# Patient Record
Sex: Male | Born: 1935 | Race: White | Hispanic: No | Marital: Married | Smoking: Former smoker
Health system: Southern US, Community
[De-identification: ages and names within clinical notes are randomized; demographics above are authoritative.]

## PROBLEM LIST (undated history)

## (undated) DIAGNOSIS — G4730 Sleep apnea, unspecified: Secondary | ICD-10-CM

## (undated) DIAGNOSIS — F0390 Unspecified dementia without behavioral disturbance: Secondary | ICD-10-CM

## (undated) DIAGNOSIS — E785 Hyperlipidemia, unspecified: Secondary | ICD-10-CM

## (undated) DIAGNOSIS — N289 Disorder of kidney and ureter, unspecified: Secondary | ICD-10-CM

## (undated) DIAGNOSIS — R55 Syncope and collapse: Secondary | ICD-10-CM

## (undated) HISTORY — DX: Hyperlipidemia, unspecified: E78.5

## (undated) HISTORY — PX: AORTIC VALVE REPLACEMENT: SHX41

## (undated) HISTORY — PX: TONSILLECTOMY: SUR1361

## (undated) HISTORY — DX: Disorder of kidney and ureter, unspecified: N28.9

## (undated) HISTORY — DX: Syncope and collapse: R55

## (undated) HISTORY — DX: Sleep apnea, unspecified: G47.30

## (undated) HISTORY — DX: Unspecified dementia, unspecified severity, without behavioral disturbance, psychotic disturbance, mood disturbance, and anxiety: F03.90

---

## 2002-10-28 HISTORY — PX: OTHER SURGICAL HISTORY: SHX169

## 2002-11-15 ENCOUNTER — Inpatient Hospital Stay (HOSPITAL_COMMUNITY): Admission: AD | Admit: 2002-11-15 | Discharge: 2002-11-22 | Payer: Self-pay | Admitting: Internal Medicine

## 2002-11-15 ENCOUNTER — Encounter (INDEPENDENT_AMBULATORY_CARE_PROVIDER_SITE_OTHER): Payer: Self-pay | Admitting: *Deleted

## 2002-11-16 ENCOUNTER — Encounter: Payer: Self-pay | Admitting: Cardiology

## 2002-11-17 ENCOUNTER — Encounter: Payer: Self-pay | Admitting: Cardiology

## 2002-11-19 ENCOUNTER — Encounter: Payer: Self-pay | Admitting: Cardiology

## 2002-11-20 ENCOUNTER — Encounter: Payer: Self-pay | Admitting: Cardiology

## 2003-03-14 ENCOUNTER — Inpatient Hospital Stay (HOSPITAL_BASED_OUTPATIENT_CLINIC_OR_DEPARTMENT_OTHER): Admission: RE | Admit: 2003-03-14 | Discharge: 2003-03-14 | Payer: Self-pay | Admitting: Cardiology

## 2003-05-02 ENCOUNTER — Encounter (INDEPENDENT_AMBULATORY_CARE_PROVIDER_SITE_OTHER): Payer: Self-pay | Admitting: Specialist

## 2003-05-02 ENCOUNTER — Inpatient Hospital Stay (HOSPITAL_COMMUNITY)
Admission: RE | Admit: 2003-05-02 | Discharge: 2003-05-08 | Payer: Self-pay | Admitting: Thoracic Surgery (Cardiothoracic Vascular Surgery)

## 2004-04-16 ENCOUNTER — Ambulatory Visit: Payer: Self-pay | Admitting: Cardiology

## 2004-04-23 ENCOUNTER — Ambulatory Visit: Payer: Self-pay | Admitting: Cardiology

## 2004-05-20 ENCOUNTER — Ambulatory Visit: Payer: Self-pay | Admitting: Cardiology

## 2004-08-24 IMAGING — CR DG CHEST 1V PORT
1 series · 1 of 1 positions shown · non-contrast
Comparison: none

CLINICAL DATA: CABG follow-up.
 PORTABLE CHEST, 05/02/03, [DATE] HOURS
 Compared to a chest x-ray from 04/29/03, endotracheal tube is in good position above the carina.  The patient has undergone CABG.  Swan-Ganz catheter is present with the tip in the region of the pulmonary outflow tract.  No pneumothorax is seen. 
 IMPRESSION
 Status-post CABG.  Endotracheal tube and Swan-Ganz catheter in satisfactory position.

[view not recorded]
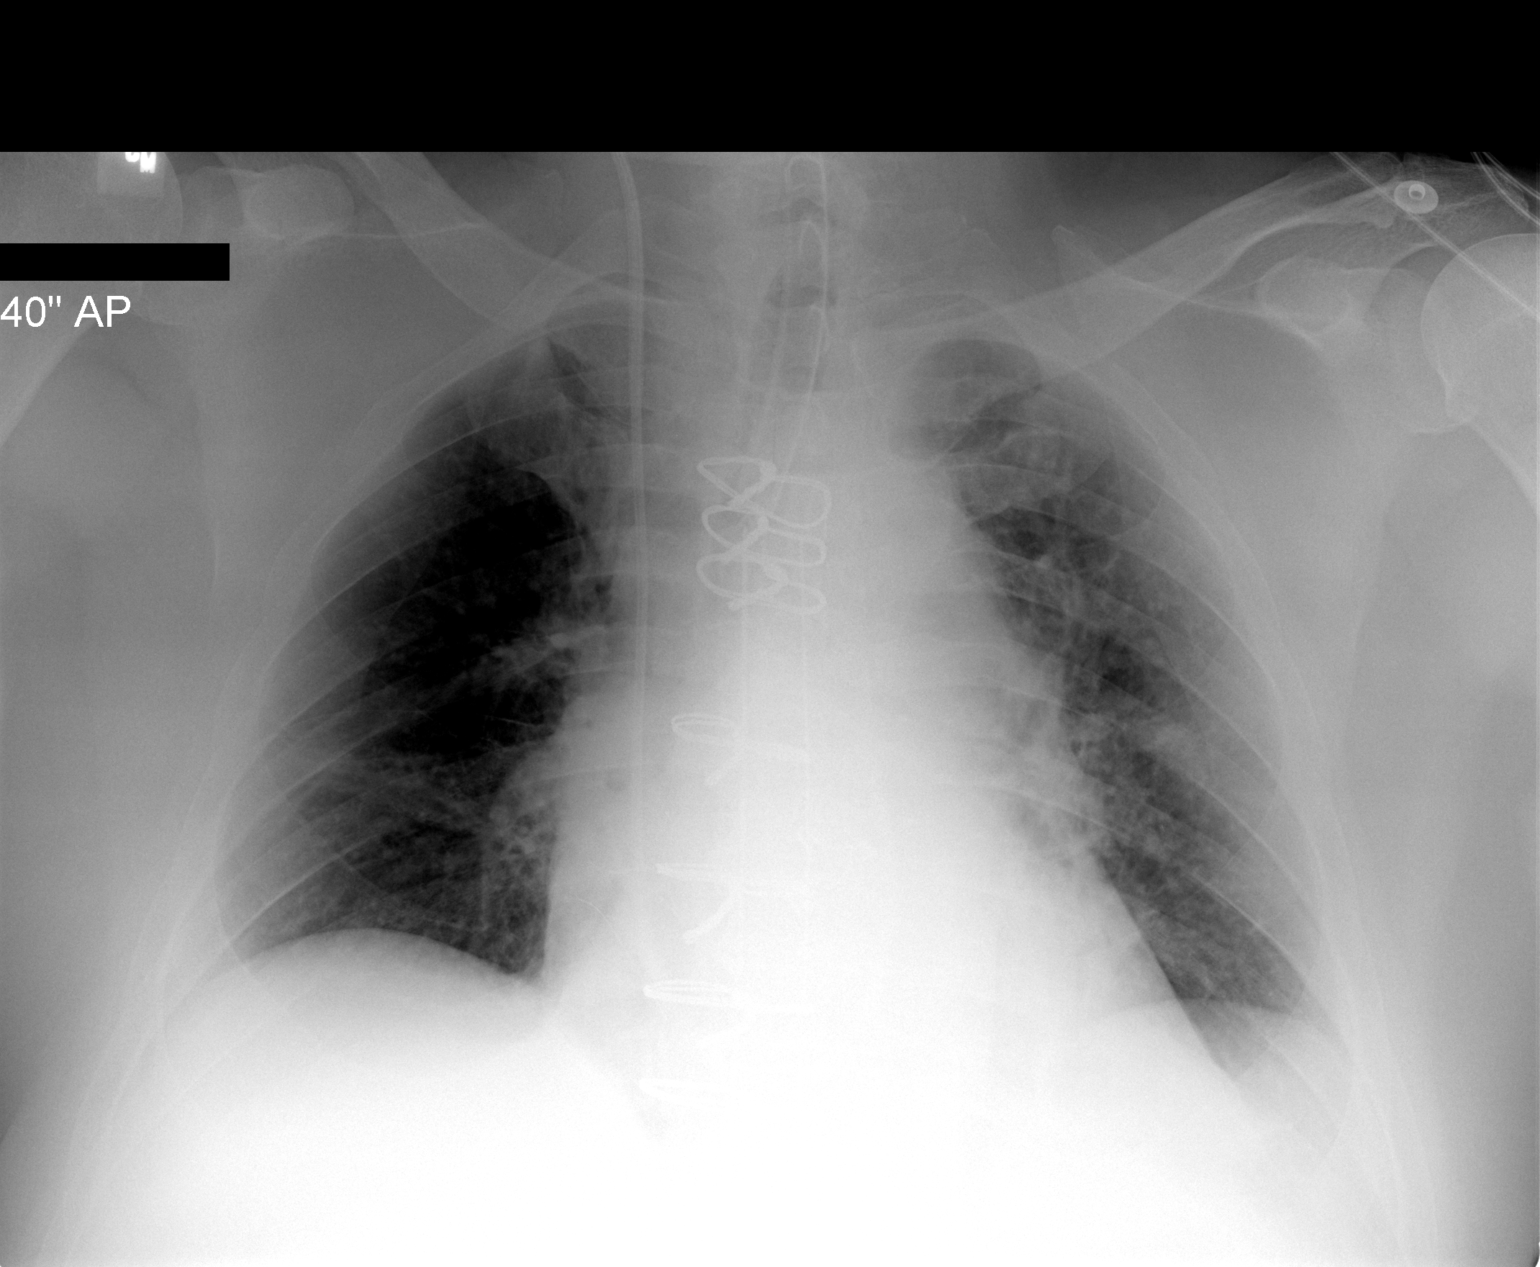

[1 of 1 positions shown; findings below may reference images not displayed]

## 2004-08-25 IMAGING — CR DG CHEST 1V PORT
1 series · 1 of 1 positions shown · non-contrast
Comparison: none

CLINICAL DATA: cardiac surgery , extubated
SINGLE PORTABLE CHEST RADIOGRAPH, 05/03/03, [DATE]
Comparison 05/02/03 and 04/29/03.

[view not recorded]
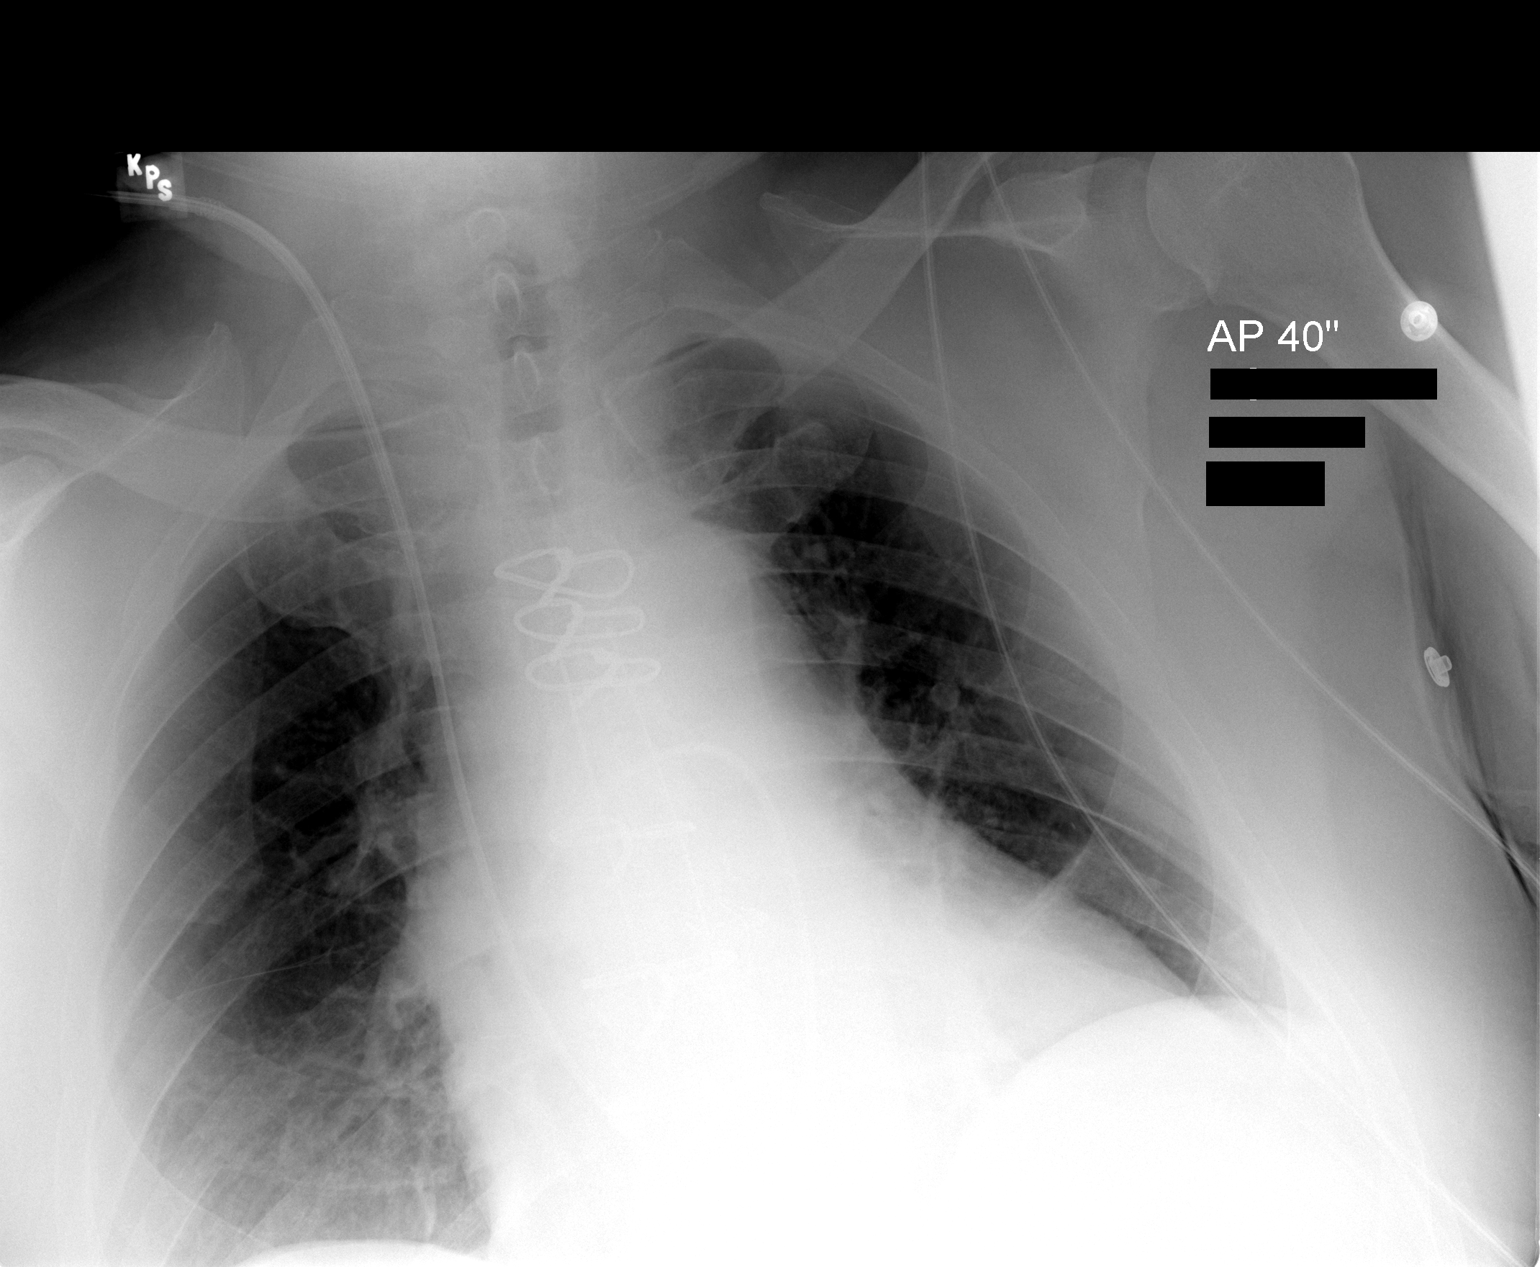

[1 of 1 positions shown; findings below may reference images not displayed]

FINDINGS: Patient has had a median sternotomy.  Endotracheal tube has been removed.  Mediastinal drains and right Swan-Ganz catheter remain.  Swan-Ganz catheter tip is in the proximal right pulmonary artery.  
There is cardiac enlargement with bilateral atelectasis, left greater than right.  Perihilar atelectasis is also evident.  There is mild vascular congestion.
IMPRESSION 
Status post extubation.  
Cardiomegaly and vascular congestion with perihilar and lower lobe atelectasis.

## 2005-12-06 ENCOUNTER — Ambulatory Visit: Payer: Self-pay | Admitting: Cardiology

## 2005-12-10 ENCOUNTER — Ambulatory Visit: Payer: Self-pay | Admitting: Cardiovascular Disease

## 2005-12-10 ENCOUNTER — Inpatient Hospital Stay (HOSPITAL_BASED_OUTPATIENT_CLINIC_OR_DEPARTMENT_OTHER): Admission: RE | Admit: 2005-12-10 | Discharge: 2005-12-10 | Payer: Self-pay | Admitting: Cardiovascular Disease

## 2005-12-23 ENCOUNTER — Ambulatory Visit: Payer: Self-pay | Admitting: Cardiology

## 2006-01-12 ENCOUNTER — Ambulatory Visit: Payer: Self-pay | Admitting: Cardiology

## 2007-11-06 ENCOUNTER — Encounter: Payer: Self-pay | Admitting: Cardiology

## 2007-11-16 ENCOUNTER — Encounter: Payer: Self-pay | Admitting: Cardiology

## 2007-12-20 ENCOUNTER — Ambulatory Visit: Payer: Self-pay | Admitting: Cardiology

## 2007-12-27 ENCOUNTER — Ambulatory Visit: Payer: Self-pay | Admitting: Cardiology

## 2008-01-14 ENCOUNTER — Ambulatory Visit: Payer: Self-pay | Admitting: Cardiology

## 2008-01-16 ENCOUNTER — Encounter: Payer: Self-pay | Admitting: Cardiology

## 2008-01-25 ENCOUNTER — Ambulatory Visit: Payer: Self-pay | Admitting: Cardiology

## 2008-05-31 ENCOUNTER — Ambulatory Visit: Payer: Self-pay | Admitting: Cardiology

## 2008-06-12 ENCOUNTER — Encounter: Payer: Self-pay | Admitting: Cardiology

## 2008-12-24 DIAGNOSIS — R609 Edema, unspecified: Secondary | ICD-10-CM

## 2008-12-24 DIAGNOSIS — I5032 Chronic diastolic (congestive) heart failure: Secondary | ICD-10-CM

## 2008-12-24 DIAGNOSIS — E785 Hyperlipidemia, unspecified: Secondary | ICD-10-CM | POA: Insufficient documentation

## 2009-02-13 ENCOUNTER — Ambulatory Visit: Payer: Self-pay | Admitting: Cardiology

## 2009-02-13 DIAGNOSIS — N189 Chronic kidney disease, unspecified: Secondary | ICD-10-CM

## 2009-02-13 DIAGNOSIS — Z954 Presence of other heart-valve replacement: Secondary | ICD-10-CM | POA: Insufficient documentation

## 2009-02-13 DIAGNOSIS — G4733 Obstructive sleep apnea (adult) (pediatric): Secondary | ICD-10-CM

## 2009-02-13 DIAGNOSIS — E119 Type 2 diabetes mellitus without complications: Secondary | ICD-10-CM

## 2009-02-13 DIAGNOSIS — Z8669 Personal history of other diseases of the nervous system and sense organs: Secondary | ICD-10-CM

## 2009-02-13 DIAGNOSIS — I10 Essential (primary) hypertension: Secondary | ICD-10-CM

## 2009-02-13 DIAGNOSIS — F068 Other specified mental disorders due to known physiological condition: Secondary | ICD-10-CM | POA: Insufficient documentation

## 2009-11-12 ENCOUNTER — Encounter: Payer: Self-pay | Admitting: Cardiology

## 2010-02-25 ENCOUNTER — Ambulatory Visit: Payer: Self-pay | Admitting: Cardiology

## 2010-02-25 DIAGNOSIS — G47419 Narcolepsy without cataplexy: Secondary | ICD-10-CM | POA: Insufficient documentation

## 2010-03-10 ENCOUNTER — Encounter: Payer: Self-pay | Admitting: Cardiology

## 2010-04-28 NOTE — Assessment & Plan Note (Signed)
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 The patient has a history of obstructive sleep apnea but is not compliant with his CPAP mask.the patient has significant dementia and is on Aricept and Namenda. The wife states that the patient often sleeps until 3:00 in the afternoon. She states that she tries to wake him up in the morning but he continues to sleep. PERRLA neck tried medications for depression but this didn't work and make the patient aggressive. He is also seeing a neurologist. From cardiac standpoint however he stable. He has some difficulty walking and has some gait imbalance. He denies any lower extremity edema or increased fluid intake. He does report some orthostatic symptoms upon standing. We did orthostatic blood pressures in the office and the patient was not orthostatic.  Preventive Screening-Counseling & Management  Alcohol-Tobacco     Smoking Status: quit     Year Quit: 2000  Current Medications (verified): 1)  Lasix 40 Mg Tabs (Furosemide) .... Take 2 Tablet By Mouth in Morning and 2 in Evening 2)  Lipitor 10 Mg Tabs (Atorvastatin Calcium) .... Take 1 Tablet By Mouth Once A Day At Bedtime 3)  Aricept 10 Mg Tabs (Donepezil Hcl) .... Take 1 Tablet By Mouth Once A Day 4)  Levamisole Hcl  Powd (Levamisole Hcl) .... Use As Directed 5)  Actos 45 Mg Tabs (Pioglitazone Hcl) .... Take 1 Tablet By Mouth Once A Day 6)  Prevacid 30 Mg Cpdr (Lansoprazole) .... Take 1 Tablet By Mouth Once A Day 7)  Toprol Xl 50 Mg Xr24h-Tab (Metoprolol Succinate) .... Take 1 Tablet By Mouth Once A Day 8)  Glipizide Xl 10 Mg Xr24h-Tab (Glipizide) .... Take 1 Tablet By Mouth Once A Day 9)  Mavik 4 Mg Tabs (Trandolapril) .... Take 1 Tablet By Mouth Once A Day 10)  Namenda 10 Mg Tabs (Memantine Hcl) .... Take 1 Tablet By  Mouth Two Times A Day 11)  Benazepril Hcl 40 Mg Tabs (Benazepril Hcl) .... Take 1 Tablet By Mouth Once A Day 12)  Felodipine 10 Mg Xr24h-Tab (Felodipine) .... Take 1 Tablet By Mouth Once A Day 13)  Provigil 200 Mg Tabs (Modafinil) .... Take 1 Tablet By Mouth Once A Day  Allergies (verified): No Known Drug Allergies  Comments:  Nurse/Medical Assistant: The patient's medication list and allergies were reviewed with the patient and were updated in the Medication and Allergy Lists.  Past History:  Past Medical History: Last updated: 02/13/2009 1. Status post aortic valve replacement, #23 Carpentier-Edwards     pericardial tissue valve with stable murmur. 2. History of syncope but no recurrence. 3. Ruled out for tachy-brady syndrome. 4. Ruled out for atrial fibrillation, atrial flutter. 5. Obstructive sleep apnea. 6. Dementia. 7. Diabetes mellitus. 8. Dyslipidemia. 9. Chronic kidney disease with creatinine of 1.5. 10.Chronic diastolic failure, lower extremity edema.  EDEMA (ICD-782.3) DIASTOLIC HEART FAILURE, CHRONIC (ICD-428.32) HYPERLIPIDEMIA-MIXED (ICD-272.4) Obstructive sleep apnea.  Dementia.  Diabetes mellitus.  Chronic kidney disease  Past Surgical History: Last updated: 12/24/2008 Tonsillectomy Percutaneous drainage of pericardial effusion in August of 2004 Valve Replacement-Aortic (S/P)  Family History: Last updated: 02/13/2009 Negative FH of Diabetes, Hypertension, or Coronary Artery Disease  Social History: Last updated: 12/24/2008 Retired  Married  Tobacco Use - Former.  Alcohol Use - no  Risk Factors: Smoking Status: quit (02/25/2010)  Review of Systems       The patient complains of shortness of breath and muscle weakness.  The patient denies fatigue, malaise, fever, weight gain/loss, vision loss, decreased hearing, hoarseness, chest pain, palpitations, prolonged cough, wheezing, sleep apnea, coughing up blood, abdominal pain, blood in stool, nausea,  vomiting, diarrhea, heartburn, incontinence, blood in urine, joint pain, leg swelling, rash, skin lesions, headache, fainting, dizziness, depression, anxiety, enlarged lymph nodes, easy bruising or bleeding, and environmental allergies.    Vital Signs:  Patient profile:   77 year old male Height:      72 inches Weight:      275 pounds BMI:     37.43 Pulse rate:   51 / minute Pulse (ortho):   60 / minute BP sitting:   95 / 55  (left arm) BP standing:   108 / 63 Cuff size:   large  Vitals Entered By: Carlye Grippe (February 25, 2010 10:49 AM)  Nutrition Counseling: Patient's BMI is greater than 25 and therefore counseled on weight management options.  Serial Vital Signs/Assessments:  Time      Position  BP       Pulse  Resp  Temp     By 11:56 AM  Lying LA  88/48    54                    Lydia Anderson 11:56 AM  Sitting   98/63    64                    Lydia Anderson 11:56 AM  Standing  108/63   60                    Carlye Grippe           Standing  113/64   55                    Carlye Grippe           Standing  110/70   74                    Carlye Grippe   Physical Exam  Additional Exam:  General: Well-developed, well-nourished in no distress head: Normocephalic and atraumatic eyes PERRLA/EOMI intact, conjunctiva and lids normal nose: No deformity or lesions mouth normal dentition, normal posterior pharynx neck: Supple, no JVD.  No masses, thyromegaly or abnormal cervical nodes lungs: Normal breath sounds bilaterally without wheezing.  Normal percussion heart: regular rate and rhythm with normal S1 and S2, no S3 or S4.  PMI is normal.  No pathological murmurs abdomen: Normal bowel sounds, abdomen is soft and nontender without masses, organomegaly or hernias noted.  No hepatosplenomegaly musculoskeletal: Back normal, normal gait muscle strength and tone normal pulsus: Pulse is normal in all 4 extremities Extremities: No peripheral pitting edema neurologic: Alert and  oriented x 3 skin: Intact without lesions or rashes cervical nodes: No significant adenopathy psychologic: Normal affect    EKG  Procedure date:  02/25/2010  Findings:      EKG demonstrates sinus bradycardia but otherwise normal. Heart rate 55 beats per minute  Impression & Recommendations:  Problem # 1:  RENAL FAILURE, CHRONIC (ICD-585.9) the patient has chronic renal insufficiency recent laboratory work demonstrates a creatinine of 2.88. This is followed by his primary care physician  Problem # 2:  DEMENTIA (ICD-294.8) the patient is on Aricept andNamenda. His symptoms are clearly  worsening  Problem # 3:  AORTIC VALVE REPLACEMENT, HX OF (ICD-V43.3) the patient is status post aortic valve replacement. He appears to have normal heart function physical examination.  Problem # 4:  NARCOLEPSY WITHOUT CATAPLEXY (ICD-347.00) the patient has symptoms consistent with narcolepsy. I gave him a prescription for modafinil 200 mg a day. He should not interfere with his other medications. I did discuss with the patient's wife that there might be some increase in blood pressure with this medication but his blood pressure is now relatively low and should not be much of an issue. We also did orthostatic blood pressure in the office today and the patient is not orthostatic.  Other Orders: EKG w/ Interpretation (93000)  Patient Instructions: 1)  Modafinil 200mg  daily 2)  Follow up in  6 months Prescriptions: PROVIGIL 200 MG TABS (MODAFINIL) Take 1 tablet by mouth once a day  #30 x 2   Entered by:   Hoover Brunette, LPN   Authorized by:   Lewayne Bunting, MD, [facility]   Signed by:   Hoover Brunette, LPN on 40/98/1191   Method used:   Print then Give to Patient   RxID:   [id number]   Handout requested.

## 2010-04-30 NOTE — Medication Information (Signed)
Summary: RX Folder/ PROVIGIL APPROVED  RX Folder/ PROVIGIL APPROVED   Imported By: Dorise Hiss 03/11/2010 11:45:06  _____________________________________________________________________  External Attachment:    Type:   Image     Comment:   External Document

## 2010-04-30 NOTE — Medication Information (Signed)
Summary: RX Folder/ FAXED PRIOR AUTHORIZATION PROVIGIL  RX Folder/ FAXED PRIOR AUTHORIZATION PROVIGIL   Imported By: Dorise Hiss 03/10/2010 15:39:30  _____________________________________________________________________  External Attachment:    Type:   Image     Comment:   External Document

## 2010-08-11 NOTE — Assessment & Plan Note (Signed)
[facility]                          [facility] NOTE   ARMONI, DEPASS                        MRN:          [id number]  DATE:05/31/2008                            DOB:          03/27/1933    REFERRING PHYSICIAN:  Kirstie Peri, MD   HISTORY OF PRESENT ILLNESS:  The patient is a 77 year old male who has  now developed significant dementia.  The patient has known aortic valve  disease status post valve replacement #23 Carpentier-Edwards pericardial  tissue valve.  Previously also had some problems with dizziness and  presyncope and was ruled out for tachy-brady syndrome.  He has  obstructive sleep apnea but does not use his CPAP mask very often.  His  wife is getting somewhat frustrated with him as his memory is really  deteriorating.  He is now seeing Dr. Benson Setting also.  He states that the  patient only sits at the kitchen table, sleeps a lot during the daytime,  and the only thing he does is eat and drink.  The patient, however, to  me reports no problems of chest pain, shortness of breath, orthopnea, or  PND.   MEDICATIONS:  1. Lipitor 10 mg p.o. at bedtime.  2. Aricept 10 mg p.o. daily.  3. Relafen 750 mg p.o. b.i.d. p.r.n.  4. Levamisole 100 units daily.  5. Aspirin 81 daily.  6. Actos 45 daily.  7. Prevacid 30 mg daily.  8. Toprol-XL 100 mg daily.  9. Lasix 40 mg p.o. b.i.d.  10.Amlodipine 10 mg p.o. b.i.d.  11.Glipizide ER 10 mg p.o. b.i.d.  12.Mavik 4 mg p.o. daily.  13.The patient also takes another medication for his dementia, but his      wife did not bring the list with her.   PHYSICAL EXAMINATION:  VITAL SIGNS:  Blood pressure 132/60, heart rate  62, respiratory rate 18.  GENERAL:  Overweight white male but in no apparent distress.  HEENT:  Pupils anisocoric.  Conjunctivae clear.  NECK:  Supple.  Normal carotid upstroke and no carotid bruit.  LUNGS:  Clear breath sounds bilaterally.  HEART:  Regular rate and rhythm.   Normal S1 and S2.  There is a 2/6  systolic murmur at the left and right upper sternal border.  There is  otherwise no S3.  ABDOMEN:  Soft and nontender.  The patient does have 3+ peripheral  pitting edema.   PROBLEM LIST:  1. Status post aortic valve replacement, #23 Carpentier-Edwards      pericardial tissue valve with stable murmur.  2. History of syncope but no recurrence.  3. Ruled out for tachy-brady syndrome.  4. Ruled out for atrial fibrillation, atrial flutter.  5. Obstructive sleep apnea.  6. Dementia.  7. Diabetes mellitus.  8. Dyslipidemia.  9. Chronic kidney disease with creatinine of 1.5.  10.Chronic diastolic failure, lower extremity edema.   PLAN:  1. The patient will have his Lasix increased to 80 in the morning and      40 in the evening.  2. From cardiovascular standpoint, however, stable.  He also had no  recurrent presyncope or syncope.  His heart rate is stable at 62.  3. I asked the patient to be compliant with a CPAP mask.  4. We will follow the patient from a cardiovascular standpoint is 6      months.     Learta Codding, MD,FACC  Electronically Signed    GED/MedQ  DD: 05/31/2008  DT: 05/31/2008  Job #: [id number]   cc:   Kirstie Peri, MD

## 2010-08-11 NOTE — Assessment & Plan Note (Signed)
[facility]                          [facility] NOTE   Cody Le, Cody Le                        MRN:          [id number]  DATE:12/20/2007                            DOB:          [birth year]    CARDIOLOGIST:  Learta Codding, MD, [facility]   PRIMARY CARE PHYSICIAN:  Kirstie Peri, MD   REASON FOR VISIT:  Post hospitalization follow-up.   HISTORY OF PRESENT ILLNESS:  Cody Le is a 77 year old male Le  with a history of aortic stenosis status post #23 Carpentier-Edwards  pericardial tissue aortic valve replacement in 2005, as well as a  history of diastolic congestive heart failure with overall preserved LV  function with an EF of 55% by echocardiogram in 2007.  He had  significant volume overload back in 2007, and he was referred for right  heart catheterization to rule out constrictive pericarditis.  At that  time his hemodynamics were consistent with diastolic dysfunction.  A  transesophageal echocardiogram demonstrated no significant mitral  regurgitation.  He did have mild pulmonary hypertension.  There was no  aortic stenosis noted.  We have actually not seen Cody Le in Cody  office since October 2007.  He was recently admitted by Dr. Sherryll Burger at  [facility] with complaints of shortness of breath.  He was  observed for 48 hours and discharged to home.  He is now returning to  see Dr. Andee Lineman for Cody first time since his hospitalization.   Cody Le tells me that he does not remember much of what happened Cody  day he was admitted to [facility] hospital.  His wife says that he passed out  in Cody garage.  This was not witnessed.  He apparently fell over and  broke some type of clothes rack in Cody garage.  His wife notes that he  made his way into Cody house and was somewhat diaphoretic and complaining  of abdominal pain and nausea.  She also notes that he told her that he  felt like he was dying.  He complained of chest pain and  shortness of  breath.  She noted that he had increased work of breathing, but no  cyanosis was noted.  He was brought to [facility] emergency room for further  evaluation and treatment.  Of note during his hospitalization his  creatinine was 1.5.  His cardiac markers were negative x3.  His BNP was  slightly elevated at 299.  His hemoglobin was 13.5.  His chest x-ray  showed stable cardiomegaly, no acute cardiopulmonary disease.  His  electrocardiogram revealed sinus rhythm, heart rate of 69, normal axis,  no acute changes.  Cody Le tells me that he was apparently treated  with some IV Lasix during his hospitalization.   Since going to [facility] hospital, Cody Le has felt well.  He feels like  he is back in his usual state of health.  He denies any significant  shortness of breath with exertion.  He describes NYHA class II to class  IIB symptoms.  He denies orthopnea or PND.  He has chronic pedal edema  without significant change.  His weights have actually gone down since  he was in [facility] hospital.  According to our scales, he has lost over 40  pounds since we saw him on October 2007.  He denies any recurrent chest  pain.  He denies any recurrent syncope or near syncope or palpitations.   CURRENT MEDICATIONS:  Lipitor 10 mg nightly, Aricept 10 mg daily,  Relafen 750 mg b.i.d. p.r.n., Levamisole 100 units daily, Aspirin 325 mg  daily, Actos 45 mg daily, Prevacid 30 mg daily,  Toprol XL 100 mg daily, Levemir insulin 6 units b.i.d., Felodipine 10 mg  daily, Glipizide ER 10 mg b.i.d., Mavik 4 mg daily, Lasix 40 mg b.i.d.   ALLERGIES:  No known drug allergies.   REVIEW OF SYSTEMS:  Cody Le denies any antecedent fevers, chills,  cough, nausea, vomiting, or diarrhea prior to his syncopal episode.   PHYSICAL EXAMINATION:  GENERAL:  He is a well-nourished well-developed  obese male in no distress.  VITAL SIGNS:  Blood pressure is 136/79, pulse 65, weight 296 pounds.  HEENT:  Normal.  NECK:  I  cannot appreciate JVD.  CARDIAC:  Normal S1, accentuated S2, regular rate and rhythm, 1/6  systolic ejection murmur best heard at right sternal border.  LUNGS:  Clear to auscultation bilaterally.  No rales.  ABDOMEN:  Soft, nontender.  EXTREMITIES:  With 1+ ankle edema bilaterally.  NEUROLOGIC:  He is alert and oriented x3.  Cranial nerves II through XII  were grossly intact.  VASCULAR:  There is a faint right carotid bruit noted (versus radiating  murmur).  Dorsalis pedis and posterior tibialis pulses 2+ bilaterally.   ASSESSMENT AND PLAN:  1. Syncope in a 77 year old male Le with a history of a      pericardial tissue aortic valve replacement in 2005 and minimal      nonobstructive coronary disease by cardiac catheterization with      overall preserved left ventricular function and a history of      chronic diastolic congestive heart failure.  Cody Le's cardiac      catheterization in 2004 revealed 25% ostial left main stenosis, 25%      proximal LAD stenosis, 30% stenosis in Cody mid AV groove.  He is a      diabetic and is at risk for progressive coronary artery disease.      However, Cody Le has already seen Dr. Sherryll Burger who arranged an      outpatient stress test.  This was performed on December 14, 2007.      Cody Le exercised for a total of 3 minutes.  His exercise      tolerance was poor.  His myocardial perfusion was noted to be      normal.  His EF was calculated at 48%.  Cody Le's last      echocardiogram was done in 2007 and this demonstrated normal left      ventricular function.  After further review with Dr. Andee Lineman, we      have decided to proceed with an echocardiogram and a 21-day event      monitor to complete Cody workup for a cardiac cause for his syncope.      At this point it is uncertain whether or not he actually did have a      syncopal episode and Cody etiology is not entirely clear.  He does      have a carotid bruit on exam.  We will also  obtain carotid      Dopplers, although this is likely not a cause for his syncope.  He      may need a neurologic cause ruled out should his cardiac workup      remain negative.  He will be brought back in follow-up after Cody      above testing is completed.  2. Diabetes mellitus.  He will continue to follow up with Dr. Sherryll Burger.  3. Hypertension.  This is overall well controlled and he will continue      on medications as outlined above.  4. Dyslipidemia.  He will continue on Lipitor and followup with Dr.      Sherryll Burger for this.  5. Chronic kidney disease.  His recent creatinine was 1.5.  This seems      to be overall stable.  6. Chronic diastolic congestive heart failure.  He seems to be      optivolemic on exam.  No further changes in his medications will be      made today.  7. Status post aortic valve replacement as outlined above.  Follow-up      echocardiogram will be obtained to reassess his aortic valve, as      well as his left ventricular function.  8. Obstructive sleep apnea.  He will continue on CPAP therapy and      follow up with Dr. Benson Setting as directed.   DISPOSITION:  Cody Le will be brought back in follow-up in Cody next  4 weeks with Dr. Andee Lineman sooner p.r.n.      Tereso Newcomer, PA-C  Electronically Signed      Learta Codding, MD,FACC  Electronically Signed   SW/MedQ  DD: 12/20/2007  DT: 12/21/2007  Job #: [id number]   cc:   Kirstie Peri, MD

## 2010-08-11 NOTE — Assessment & Plan Note (Signed)
[facility]                          [facility]   Cody Le, Cody Le                        MRN:          [id number]  DATE:01/25/2008                            DOB:          1933/03/09    REFERRING PHYSICIAN:  Kirstie Peri, MD   REFERRING PHYSICIAN:  Kirstie Peri, MD   HISTORY OF PRESENT ILLNESS:  The patient is a 77 year old male with a  history of diabetes mellitus and chronic diastolic heart failure.  The  patient is status post aortic valve replacement for aortic stenosis.  He  also has obstructive sleep apnea.  More recently, the patient has  developed an Alzheimer dementia which according to his wife has  progressed.  The patient presents for followup after he had an office  visit on December 20, 2007.  After he has been hospitalized, the  patient had a questionable syncopal episode and evaluation for tachy-  brady syndrome was made with a CardioNet monitor.  Review of the  CardioNet monitor demonstrated heart rates varying from 36-122 beats per  minute.  There was very brief episodes of PF and flutter, but the heart  rates never exceeded 100 beats per minute.  He had only one run of a 5-  beat complex of wide complex tachycardia at under 50 beats per minute.  There were no associated symptoms.  Of note, the patient's CHAD2 score  equals to 3.  He has diabetes mellitus, hypertension, and congestive  heart failure.  The patient stated, overall, he is doing better since  his last office visit.  He has no chest pain.  No shortness of breath.  He has had no presyncope or syncope and no palpitations.  He denies any  orthopnea or PND.  Carotid Dopplers were also ordered which showed  essentially nonobstructive carotid artery disease.   MEDICATIONS:  1. Lipitor 10 mg p.o. nightly.  2. Aricept 10 mg p.o. daily.  3. Relafen 750 mg p.o. b.i.d. p.r.n.  4. Levamisole 100 units p.o. daily.  5. Enteric-coated aspirin 325 mg p.o.  daily.  6. Actos 45 mg p.o. daily.  7. Prevacid 30 mg p.o. daily.  8. Toprol-XL 100 mg p.o. daily.  9. Amlodipine 10 mg p.o. daily.  10.Glipizide ER 10 mg p.o. b.i.d.  11.Mavik 4 mg p.o. daily.  12.Lasix 40 mg p.o. b.i.d.   PHYSICAL EXAMINATION:  VITAL SIGNS:  Blood pressure 160/74, heart rate  56, and weight is 371 pounds.  GENERAL:  A well-nourished white male and overweight, but in no  distress.  HEENT:  Pupils are equal.  Conjunctivae are clear.  NECK:  Normal carotid upstroke.  Soft carotid bruits.  HEART:  Normal S1.  Normal closing click of S2.  Regular rate and  rhythm.  No systolic murmur at sternal border.  LUNGS:  Clear to auscultation bilaterally.  No crackles.  ABDOMEN:  Soft and nontender.  EXTREMITIES:  Trace to 1+ ankle edema bilaterally.  Dorsalis pedis and  posterior tibial pulses are intact bilaterally.  NEUROLOGIC:  The patient is alert and grossly non focal.   PROBLEM LIST:  1. Status post aortic valve replacement with a #23 Carpentier-Edwards      pericardial tissue valve, stable.  2. Questionable syncope/questionable tachy-brady syndrome.  3. Rule out atrial fibrillation and atrial flutter.  4. Bradycardia.  5. Obstructive sleep apnea.  6. Diabetes mellitus.  7. Rule out dysautonomia.  8. Dyslipidemia.  9. Chronic kidney disease.  10.Chronic diastolic failure.   PLAN:  1. The patient had no further episodes of dizziness or definitively no      loss of consciousness.  His CardioNet monitor demonstrate that he      possibly has a tachy-brady syndrome with a lowest heart rate of 36.      His upper heart rates were never really very high.  Therefore, I      have given him a recommendation to cut back on his Toprol-XL 50 mg      p.o. daily and we will follow him closely.  I do not think there is      any pacemaker indication as of yet.  2. The patient's progressing Alzheimer dementia precludes him from      using a Coumadin.  He only had very brief runs  of atrial      fibrillation and atrial flutter; however, asymptomatic.  I would be      really concerned to give this gentleman Coumadin therapy as he has      a risk for falls with his dementia.  We will continue aspirin.  3. I have discussed all the above with his wife very carefully and she      understands the plan.  4. The patient will follow up with [place] in the next couple of months.      He also had a recent Cardiolite study done by Dr. Sherryll Burger which was      within normal limits with no ischemia.     Learta Codding, MD,FACC  Electronically Signed    GED/MedQ  DD: 01/28/2008  DT: 01/29/2008  Job #: [id number]   cc:   Kirstie Peri, MD

## 2010-08-14 NOTE — Cardiovascular Report (Signed)
NAMESHAMIR, Cody Le                             ACCOUNT NO.:  [id number]   MEDICAL RECORD NO.:  [id number]                   PATIENT TYPE:  OIB   LOCATION:  6501                                 FACILITY:  [facility]   PHYSICIAN:  Rollene Rotunda, M.D.                DATE OF BIRTH:  [birth year]   DATE OF PROCEDURE:  03/14/2003  DATE OF DISCHARGE:                              CARDIAC CATHETERIZATION   PRIMARY CARE PHYSICIAN:  Dr. Weyman Pedro   PROCEDURE:  1. Left and right heart catheterization.  2. Coronary arteriography.   INDICATIONS:  Evaluate patient with aortic stenosis and increasing dyspnea.   PROCEDURAL NOTE:  The patient was catheterized using a #5-French sheath  placed via the modified Seldinger technique with an anterior wall puncture  and to the right femoral artery.  An 8-French venous sheath was inserted via  the same method into the right femoral vein.  A 5-French Judkins catheters  and a Swan-Ganz catheter were utilized.  The patient tolerated procedure  well and left the laboratory in stable condition.   RESULTS:  HEMODYNAMICS:  RA mean 9.  RV 35/10.  PA mean 23.  Pulmonary capillary wedge pressure mean  17.  LV 168/16.  AO 128/70.  Cardiac output 5.5.  Cardiac index 2.2 (Fick).  Aortic valve area 0.77.  Aortic valve index 0.31.  Peak gradient 40, mean  32.   CORONARIES:  The left main had ostial 25% stenosis.  The proximal LAD had 25% stenosis.  There was a large first diagonal which was normal.  The circumflex was a  large system.  There was a small ramus intermediate which was normal.  There  was 30% stenosis in the mid AV groove between OM1 and OM2.  OM1 and OM2 were  moderate sized vessels and normal.  OM3 was a large vessel with diffuse  luminal irregularities.  The right coronary artery was dominant and normal.   LEFT VENTRICULOGRAM:  The left ventriculogram was obtained in the RAO  projection.  The EF was 65%.   CONCLUSION:  1. Severe aortic stenosis.  2.  Well preserved left ventricular function.  3. Nonobstructive coronary disease.   PLAN:  The patient will be referred back to Dr. Andee Lineman to discuss aortic  valve replacement.  He is also to follow up for questionable sleep apnea.                                               Rollene Rotunda, M.D.    JH/MEDQ  D:  03/14/2003  T:  03/14/2003  Job:  [id number]   cc:   Weyman Pedro, M.D.   [facility] [place]

## 2010-08-14 NOTE — Assessment & Plan Note (Signed)
[facility]                            [facility]   Cody Le                        MRN:          [id number]  DATE:12/23/2005                            DOB:          1932/07/05    PRIMARY CARDIOLOGIST:  Dr. Andee Lineman.   REASON FOR OFFICE VISIT:  Post hospital/elective diagnostic catheterization  followup.   [name] is a 77 year old male with complex medical history, recently  referred to Dr. Andee Lineman in consultation on September 11, here at [facility] for evaluation of severe lower extremity edema with question of  anasarca.   The patient was felt to have possible positive Kussmaul's sign suggestive of  constrictive pericarditis following an extensive workup, including rule out  of thromboembolic disease, as well as evaluation by Dr. Kristian Covey for renal  insufficiency.   The patient was treated aggressively with IV diuretic and was discharged  after 4 days.  An echocardiogram was done in the hospital, revealed mild  LVH/a preserved LV function (EF 55%) with suggestion of mild-moderate mitral  regurgitation.  The bioprosthetic aortic valve was well seated with no  evidence of perivalvular leak (mean pressure gradient 17 mmHg); there was no  evidence of pericardial effusion.   Arrangements were made for a followup diagnostic catheterization in the [facility] the following day.  This was arranged as a right heart catheterization  (left heart cath was deferred secondary to renal insufficiency and no  complaint of chest pain).  The hemodynamics were consistent with diastolic  dysfunction and it was noted that the most pertinent finding was a large V  wave in the pulmonary capillary wedge position.  Dr. Excell Seltzer commented that  the hemodynamics were not consistent with pericardial constriction.  Recommendation was to continue treatment for diastolic dysfunction.   The patient now presents for followup.  He reports no  significant change  clinically since being discharged.  His wife, however, feels that despite  the fact that he has lost some 20 pounds since his recent hospitalization,  he seems to be gaining his fluid back.  He apparently is quite somnolent  during the day, but does have diagnosed sleep apnea and is reportedly on  CPAP.  The diuretic regimen apparently was up titrated to the current Lasix  80 b.i.d.   CURRENT MEDICATIONS:  1. Lasix 80 mg b.i.d.  2. Lipitor 40 daily.  3. Coated aspirin 325 daily.  4. Actos 45 daily.  5. Prevacid 30 daily.  6. Toprol XL 100 daily.  7. Levemir 6 units daily.  8. Felodipine 10 mg daily.   PHYSICAL EXAMINATION:  Blood pressure 130/72.  Pulse 68, regular.  Weight  339.  NECK:  Palpable carotid pulses without bruits; unable to assess for JVD  secondary to neck girth.  LUNGS:  Clear to auscultation in all fields.  HEART:  Regular rate rhythm (S1, S2), harsh grade 2-3/6 systolic ejection  murmur at the base; no diastolic blow.  ABDOMEN:  Protuberant, nontender.  EXTREMITIES:  Right groin stable with no ecchymosis, hematoma, or bruit on  auscultation; palpable femoral  pulse.  Noted bilateral lower extremity edema  with some mild pitting.  NEUROLOGIC:  Somnolent.   IMPRESSION:  1. Persistent severe lower extremity edema.  2. Diastolic heart failure.      a.     No evidence of constrictive pericarditis by recent right heart       catheterization.  3. History of nonobstructive coronary artery disease.  4. Status post bioprosthetic AVR in 2004.  5. Obstructive sleep apnea.  6. Morbid obesity.  7. Insulin-requiring diabetes mellitus.  8. Dyslipidemia.  9. Status post acute/chronic renal insufficiency.      a.     History of negative duplex scan for renal artery stenosis.      b.     Hypertension, stable.   PLAN:  At this juncture, recommendation is to continue the current diuretic  regimen.  We will check followup labs with a complete metabolic  profile and  a BMP level.  We will also check a room air blood gas.  Of note, for further  assessment of possibly severe mitral regurgitation (possible underestimation  of the severity of mitral regurgitation by recent echocardiogram), we will  schedule a transesophageal echocardiogram for early next week.  We will then  plan on having the patient return to the clinic for continued close followup  with Dr. Andee Lineman in the next 2-3 weeks.     ______________________________  Rozell Searing, PA-C    ______________________________  Learta Codding, MD,FACC   GS/MedQ  DD:  12/23/2005  DT:  12/25/2005  Job #:  [id number]   cc:   Weyman Pedro

## 2010-08-14 NOTE — Discharge Summary (Signed)
[name], [name]                             ACCOUNT NO.:  [id number]   MEDICAL RECORD NO.:  [id number]                   PATIENT TYPE:  INP   LOCATION:  4710                                 FACILITY:  [facility]   PHYSICIAN:  Pricilla Riffle, M.D.                 DATE OF BIRTH:  1932/12/12   DATE OF ADMISSION:  11/15/2002  DATE OF DISCHARGE:  11/22/2002                           DISCHARGE SUMMARY - REFERRING   DISCHARGE DIAGNOSES:  1. Chest pain resolved.  2. Known coronary artery disease.  3. Atrial fibrillation with conversion to normal sinus rhythm.  4. Moderate aortic stenosis.  5. Pericardial effusion, status post emergent PAP.  6. Diabetes mellitus, oral agent dependent.  7. Hypertension.  8. Metabolic syndrome.   HOSPITAL COURSE:  [name] is a 77 year old male patient of Dr. Weyman Pedro who presented to [facility] on November 14, 2002 with  chest pain.  Cardiology was consulted on November 15, 2002.  The patient tells  [place] that he initially presented to the [facility] Emergency Room, was kept  overnight and reportedly underwent a tread mill test the next day. He states  that he stayed on the treadmill for approximately 1 minute.  Apparently he  ruled out for myocardial infarction during that admission and was sent home  and told that he probably had reflux.  The wife remained concerned and  proceeded to bring the patient to [facility].  Over the past  several weeks the patient has had several episodes of paleness, diaphoresis,  and shortness of breath.  His functional capacity has also declined over the  past 3 months. On several occasions he has had brief episodes of substernal  chest tightness in the emergency room.  The patient was admitted to the  hospital by Dr. Eliberto Ivory to rule out myocardial infarction.  A 12-lead EKG  revealed no acute changes.  Upon examination the patient was noted to have a  holosystolic murmur at the apex as well as  JVD.  The patient then underwent  a 2-D echo that revealed mild LV systolic dysfunction with mild ventricular  dilatation, moderate aortic stenosis.  A circumferential pericardial  effusion with intrapericardial echo.  A CT of the chest was also obtained  with an elevated D-dimmer of 1552.  This did confirm a moderate sized  pericardial effusion with small bilateral pleural effusions.  The patient  was then transferred to [facility] for urgent catheterization.   The patient underwent left and right heart catheterization on November 15, 2002 under the care of Dr. Nona Dell.  The patient was found to have  left main 60% and eventually intracoronary nitro was administered which  revealed an eccentric 30% osteal left main stenosis which did not appear to  be hemodynamically significant.  LAD proximal 40%, 30% midstenosis,  circumflex 40% mid, RCA dominant with no major obstructive  stenosis.  LV-  gram revealed an EF of 60% with 1+ MR with no focal wall motion  abnormalities.  The right heart catheterization revealed dynamics consistent  with tamponade physiology, especially with the history of moderate-to-large  circumferential pericardial effusion resulting in moderate aortic stenosis  with a calculated valve are of 1.0 cm.sq.   During the procedure the patient developed new onset of atrial fibrillation  with RBR.  At this point he decompensated and his systolic blood pressure  dropped from 150 to less than 60.  Dr. Samule Ohm then performed an emergent  pericardial centesis resulting in immediate improvement in the patient's  hemodynamic status. At this point we are awaiting full diagnostic studies,  but so far these have been negative.  There are severe today still pending  and these will need to be looked at through E-chart when the patient comes  into the office.  There was a question of gram positive rods and this was  felt to be a skin contaminant, otherwise cytology was  negative.   A TEE was performed by Dr. Vida Roller on November 21, 2002 which revealed  LV of normal size and function with EF of 55% and no wall motion  abnormalities.  The left atrium was mildly enlarged with small LAA.  RA  mildly enlarged. There was minimal pericardial thickening.  The aortic valve  was trileaflet.  There was severe calcification with a pink gradient of 35  mm.  There was moderate stenosis with mild AI.   By November 22, 2002 the patient was felt ready to be discharged to home.  At  this point we will discharge him to home with a follow up echo in 1 week.  At that point he will then seen Dr. Andee Lineman.   Lab studies during the patient's sty include white count of 12.5 (post  procedure), hemoglobin 12.6, hematocrit 30.1, platelets 261.  Sodium 134,  potassium 4.0, BUN 14, creatinine 1.1, maximum BNP 269.  TSH 1.329.  Again  pericardial fluids will need to be reviewed once the patient comes in for  follow up next week.   The patient also underwent a repeat CT of the chest with contrast  postprocedure and this revealed tiny residual of pericardial effusion versus  pericardial thickening.  The patient did require drain in place for  approximately 48 hours after the paracentesis.  Bilateral pleural effusions  persistent.  CT of the abdomen was also performed that revealed bilateral  nephrolithiasis with a possible cyst in the lower pole of the right kidney.  An ultrasound was then performed and this revealed no hydronephrosis, no  renal mass or calculi noted by ultrasound.   DISCHARGE MEDICATIONS:  1. Glucotrol 10 mg a day.  2. Glucophage 1000 mg twice a day.  3. HCTZ 25 mg a day.  4. Lipitor 40 mg a day (new dose).  5. Mavik 4 mg 2 tablets daily.  6. Baby aspirin 81 mg a day.  7. Lopressor 50 mg 1/2 tablet twice a day.  8. Sublingual nitroglycerin p.r.n. chest pain.  9. Tylenol 1-2 tablets q.6h. as needed for pain.   DISCHARGE INSTRUCTIONS: 1. No strenuous  activity.  2. No driving until seen in the office.  3. Remain on a low fat diet.  4.     Clean over puncture sites with soap and water.  5. Call for questions or concerns.  Dr. Margarita Mail office will call the     patient and give him an appointment  for 2-D echo prior to the visit with     Dr. Andee Lineman.  These visits will occur on the same day.      Guy Franco, P.A. [facility]                      Pricilla Riffle, M.D.    LB/MEDQ  D:  11/22/2002  T:  11/22/2002  Job:  [id number]   cc:   Oley Balm, M.D.  [facility]   Learta Codding, M.D.  [address]  [address]  [place]  [place] [address]

## 2010-08-14 NOTE — Op Note (Signed)
Cody Le, Cody Le                             ACCOUNT NO.:  [id number]   MEDICAL RECORD NO.:  [id number]                   PATIENT TYPE:  INP   LOCATION:  2301                                 FACILITY:  [facility]   PHYSICIAN:  Judie Petit, M.D.              DATE OF BIRTH:  10-Mar-1933   DATE OF PROCEDURE:  05/02/2003  DATE OF DISCHARGE:                                 OPERATIVE REPORT   PROCEDURE:  Transesophageal echocardiography.   ATTENDING:  Judie Petit, M.D.   IDENTIFICATION:  [name] is a 77 year old male who presents today for  aortic valve replacement by Dr. Purcell Nails.   INDICATION FOR PROCEDURE:  The TEE will be utilized intraoperatively.  The  patient has no history of esophageal problems.   DESCRIPTION OF PROCEDURE:  The patient was brought to the holding area where  pulmonary artery and arterial lines were placed under local anesthesia  without difficulty.  The patient was subsequently taken to the operating  room where routine induction of anesthesia was performed.  The TEE sleeve  was lubricated and the probe was placed down the esophagus without  difficulty.   PRE-BYPASS CARDIOPULMONARY EXAMINATION:  Left ventricle:  The left ventricle  was concentrically thickened.  Papillary muscles were well-outlined.  There  were no masses noted within the left ventricular chamber.  Ejection fraction  was within normal limits.   Aortic valve:  The aortic valve was trileaflet in nature.  The valve was  calcified and severe aortic stenosis was noted.  Aortic valve area measured  was approximately 0.7.  There was no significant aortic insufficiency.   Mitral valve:  The mitral valve appeared to open and close appropriately.  There was no significant mitral regurgitant flow.   Left atrium:  The left atrium appeared free of any masses.  The intra-atrial  septum was intact.  The appendage was not well-visualized.   Right ventricle:  The right ventricle appeared  overall within normal limits.  The tricuspid valve was normal in appearance.   The patient was placed on cardiopulmonary bypass and underwent aortic valve  repair.  At the completion of the procedure, deairing maneuvers were carried  out.  The patient was taken off cardiopulmonary bypass on the initial  attempt.   POST-CARDIOPULMONARY BYPASS LIMITED EXAMINATION:  Left ventricle:  The left  ventricle appeared normal.  On initial examination, low volume was noted but  with replacement of volume, this improved.   Aortic valve:  The aortic valve appeared to be seated properly.  There did  not appear to be any obstruction to flow.  There was no significant aortic  insufficiency.   The rest of the cardiac exam was as previously described.  Judie Petit, M.D.    CE/MEDQ  D:  05/02/2003  T:  05/03/2003  Job:  [id number]

## 2010-08-14 NOTE — Cardiovascular Report (Signed)
NAMEJOEL, COWIN                 ACCOUNT NO.:  [id number]   MEDICAL RECORD NO.:  [id number]          PATIENT TYPE:  OIB   LOCATION:  1965                         FACILITY:  [facility]   PHYSICIAN:  Veverly Fells. Excell Seltzer, MD  DATE OF BIRTH:  1932-11-13   DATE OF PROCEDURE:  12/10/2005  DATE OF DISCHARGE:                              CARDIAC CATHETERIZATION   PROCEDURE:  Left and right heart catheterization.   INDICATION:  Mr. Rathke is a 77 year old obese male who has had a great deal  of weight gain and exertional dyspnea. He has been noted to have anasarca on  examination and with his past history of cardiac surgery as well as a  possible Kussmaul sign on physical examination. The patient was referred for  left and right cardiac catheterization to rule out pericardial constrictive  disease.   Access is right femoral artery and right femoral vein.   COMPLICATIONS:  None.   PROCEDURAL DETAILS:  Procedural risks and indications were explained to the  patient. Informed consent was obtained. The right groin was prepped, draped  and anesthetized with 1% lidocaine under normal sterile conditions. Using  the modified Seldinger technique, a #7 French venous sheath was placed in  the right common femoral vein and a #5 [place] arterial sheath was placed in  the right common femoral artery. Initially the aortic valve was crossed with  first attempt using a J-tipped wire and an angled pigtail catheter. This was  not successful at crossing his bioprosthetic aortic valve. I then tried a  straight tip wire and changed  to an AL-1 catheter which successfully  crossed the valve. The AL-1 catheter was left in the left ventricle and  simultaneous hemodynamics were recorded in the left ventricle with the right  heart.  Using a Theone Murdoch catheter, right heart pressures were recorded in  the right atrial, right ventricular, PA, pulmonary capillary wedge position.  Oxygen saturations were drawn from the SVC,  pulmonary artery and left  ventricle and thermodilution cardiac outputs were performed. The patient had  no complications from the procedure. At the conclusion of the case the  sheaths were pulled and manual pressure was used for hemostasis.   FINDINGS:  Right atrial pressure A-wave 20, V-wave 19, mean pressure 17,  right ventricular pressure was 48/12 with a mean of 15, pulmonary artery  pressure was 44/16 with a mean of 29, pulmonary capillary wedge pressure A-  wave of 27, V-wave of 39 and a mean pressure of 24.  Left ventricular  pressure was 136/1 with an end-diastolic pressure of 14 and aortic pressure  was 136/61 with a mean of 91.   OXYGEN SATURATIONS: SVC with 67%, PA 71%, left ventricle 94%.   CARDIAC OUTPUT VIA THERMODILUTION:  Was 9.4 liters per minute with a cardiac  index of 4.2.   ASSESSMENT:  Mr. Jarriel has hemodynamics consistent with diastolic  dysfunction. Most pertinent finding was a large V-wave in the pulmonary  capillary wedge position.  This could be secondary to mitral regurgitation  but this was not seen on his echocardiogram and I suspect  it is secondary to  a stiff left ventricle.  He does not have hemodynamics consistent with  pericardial constriction as there is no dip in plateau pattern.  His right  and left  ventricular diastolic wave forms do not exactly track each other and there  is no ventricular interdependence.  I discussed the findings with Dr. Andee Lineman  and we will treat him for his diastolic dysfunction and increase his  diuretics with a follow up BMET in 1 week. The patient will follow up with  Dr. Andee Lineman in the clinic.      Veverly Fells. Excell Seltzer, MD  Electronically Signed     MDC/MEDQ  D:  12/10/2005  T:  12/11/2005  Job:  [id number]   cc:   Learta Codding, MD,FACC

## 2010-08-14 NOTE — H&P (Signed)
[name], Le                             ACCOUNT NO.:  [id number]   MEDICAL RECORD NO.:  [id number]                   PATIENT TYPE:  INP   LOCATION:                                       FACILITY:  [facility]   PHYSICIAN:  Salvatore Decent. Cornelius Moras, M.D.              DATE OF BIRTH:  05-02-1932   DATE OF ADMISSION:  05/01/2003  DATE OF DISCHARGE:                                HISTORY & PHYSICAL   REFERRING CARDIOLOGIST:  Learta Codding, M.D.   REASON FOR HOSPITAL ADMISSION:  Severe aortic stenosis.   HISTORY OF PRESENT ILLNESS:  Cody Le is a 77 year old, obese, white male  with known history of aortic stenosis, hypertension, type 2 diabetes  mellitus, hyperlipidemia, and obstructive sleep apnea.  He originally  presented in August of 2004 with shortness of breath, fatigue, weakness, and  presyncope.  At that time, he was found to have a pericardial effusion with  early tamponade physiology and he was brought emergently to cardiac  catheterization laboratory on November 15, 2002, for left and right heart  catheterization followed by pericardiocentesis.  He apparently developed  atrial fibrillation with hemodynamic instability during coronary artery  injection.  He stabilized rapidly after drainage of his pericardial  effusion.  Pericardial fluid was notable for benign findings and his  ultimate presumed diagnosis has been attributed to viral pericarditis with  secondary development of pericardial effusion.  He was also noted at that  time to have moderate to severe aortic stenosis.  Initially after drainage  of his pericardial effusion, he improved considerably.  However, over the  ensuing several months he has developed recurrent symptoms of exertional  shortness of breath and fatigue.  He returned to see Dr. Andee Lineman in December  and he was brought in for repeat cardiac catheterization and coronary  arteriography by Dr. Antoine Poche on March 12, 2003.  Findings at the time of  catheterization  were notable for severe aortic stenosis with preserved left  ventricular function.  There was no significant coronary artery disease  identified.  His peak and mean aortic transvalvular gradients were 40 and 32  mmHg, respectively, corresponding to an aortic valve of 0.7 sq cm.  For this  large gentleman, this corresponded to an aortic valve index of 0.31.  Right  heart pressures were mildly elevated with PA pressure mean of 23 mmHg and a  pulmonary capillary wedge pressure of 17.  His left ventricular end-  diastolic pressure was 16.  His ejection fraction was normal at 65%.  Mr.  Le has now been referred for possible elective valve replacement.   REVIEW OF SYSTEMS:  GENERAL:  The patient reports feeling tired all of the  time.  His wife complains that he has no energy and does not want to do much  of any thing.  He reports good appetite and denies any recent weight gain or  weight  loss.  He currently weighs 313 pounds.  He is 6 feet tall.  CARDIAC:  The patient specifically denies any symptoms of chest pain or chest  tightness either with exertion or at rest.  He denies any palpitations.  He  denies any dizzy spells or presyncopal episodes since his acute presentation  last August.  He has moderate to severe exertional shortness of breath with  relatively mild physical activity.  He has bilateral lower extremity edema  which has progressed.  He denies any PND or orthopnea.  RESPIRATORY:  Notable for exertional shortness of breath.  The patient denies productive  cough, hemoptysis, or wheezing.  GASTROINTESTINAL:  Essentially negative.  The patient does report some dyspepsia which may be consistent with GE  reflux.  He denies any difficulty swallowing.  He denies any problems with  his bowels, constipation, or diarrhea.  He denies any history of  hematochezia, hematemesis, or melena.  GENITOURINARY:  Negative.  The  patient denies urinary frequency or burning.  PERIPHERAL VASCULAR:   Negative.  The patient denies symptoms suggestive of claudication.  NEUROLOGIC:  Negative.  The patient denies symptoms suggestive of TIA or  previous stroke.  The patient denies a history of seizures.  MUSCULOSKELETAL:  Notable for mild arthralgias.  PSYCHIATRIC:  The patient  denies any definite history of depression, although he does report symptoms  of lack of energy that certainly could be contributed to an underlying  depression.  HEENT:  Negative.  The patient denies any loose teeth or poor  dentition.  HEMATOLOGIC:  Negative.  The patient denies bleeding diaphysis.   PAST MEDICAL HISTORY:  1. Aortic stenosis.  2. Pericarditis with pericardial effusion, status post percutaneous drainage     in August of 2004.  3. Hypertension.  4. Hyperlipidemia.  5. Type 2 diabetes mellitus.  6. Obesity.  7. Obstructive sleep apnea.   PAST SURGICAL HISTORY:  1. Tonsillectomy.  2. Percutaneous drainage of pericardial effusion in August of 2004.   FAMILY HISTORY:  Noncontributory.   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  They have recently moved to [place], [place].  He is a  nonsmoker, although he has a previous history of tobacco abuse, smoking one  packs of cigarettes per day for many years.  He quit smoking in 2000.  He  denies a history of significant alcohol consumption.   CURRENT MEDICATIONS:  1. Glucotrol XL 10 mg twice daily.  2. Lipitor 40 mg once daily.  3. Mavik 4 mg twice daily.  4. Metoprolol 50 mg once daily.  5. Metformin 1500 mg every morning and 1000 mg every evening.  6. Aspirin 325 mg once daily.  7. Hydrochlorothiazide 25 mg daily.  8. Multivitamins one tablet daily.   DRUG ALLERGIES:  None known.   PHYSICAL EXAMINATION:  GENERAL APPEARANCE:  The patient is an obese white  male who appears his stated age in no acute distress. VITAL SIGNS:  The blood pressure is 132/80 and pulse 72 and regular.  He is  afebrile  with oxygen saturation 96% on room air.  HEENT:  Essentially within normal limits.  NECK:  Supple.  There is no palpable cervical or supraclavicular  lymphadenopathy.  There is no jugular venous distention.  No carotid bruits  are noted.  CHEST:  Auscultation of the chest reveals clear and symmetrical breath  sounds bilaterally.  No wheezes or rhonchi are noted.  There are a few  bibasilar  crackles.  CARDIOVASCULAR:  A grade 3/6 systolic murmur heard best at the right sternal  border with radiation to the neck and across the precordium.  No diastolic  murmurs are noted.  ABDOMEN:  Obese, but soft and nontender.  The liver edge is not palpable.  There are no palpable masses.  Bowel sounds are present.  EXTREMITIES:  Warm and well perfused.  There is mild bilateral lower  extremity edema extending half way up both calves.  There is no sign of  significant venous insufficiency.  Distal pulses are diminished in both  lower legs at the ankle.  RECTAL:  Deferred.  GENITOURINARY:  Deferred.  NEUROLOGIC:  Examination is grossly nonfocal and symmetric throughout.   DIAGNOSTIC TESTS:  Cardiac catheterization performed on March 12, 2003,  by Dr. Antoine Poche has been reviewed and notable for findings as described  previously.  I agree that the very mild left main coronary stenosis appears  insignificant, probably less than 25-30% in severity.  There appears to be  normal left ventricular systolic function.  There may be abnormal left  ventricular diastolic relaxation due to left ventricular hypertrophy.  There  is severe aortic stenosis.  I also agree the Cody Le clearly seems to be  symptomatic with respect to his aortic stenosis and I believe he would  benefit from elective aortic valve replacement.  Whether or not all of his  symptoms of fatigue and sleepiness will be alleviated after aortic valve  replacement remains to be seen.  It is possible that obstructive sleep apnea  is playing a  role in his sleepiness and lack of energy during the day.  His  obesity certainly plays a part in his long-term issues as well.  Nevertheless, he will benefit from elective aortic valve replacement from  the standpoint of likely symptomatic improvement and most importantly,  preservation of myocardium and prolongation of life.  To my knowledge, he  only had one episode of atrial fibrillation and this was at the time of  catheterization in the setting of acute pericardial tamponade with  pericarditis.  As such, he probably is at relatively low risk for the  development of recurrent paroxysmal atrial fibrillation in the future.  As  such, the addition of concomitant modified Cox Maze procedure at the time of  surgery probably is unnecessary and not worth the associated potential for  morbidity.   PLAN:  I have outlined issues at length with Cody Le and his wife.  All of their questions have been addressed.  Alternative treatment strategies  have been discussed.  We have specifically reviewed the relative risks and  benefits of proceeding with elective aortic valve replacement at this  juncture versus continued conservative followup.  We have also discussed the  relative risks and benefits of mechanical valve replacement and long-term  need for anticoagulation versus use of a bioprosthetic tissue valve.  Mr.  Le desires to avoid the long-term need for anticoagulation and therefore  prefers the use of a bioprosthetic tissue valve.  He understands that at his  age there is a relatively small, but significant potential for late valve  degeneration with the use of bioprosthetic tissue valve.  Nevertheless, I  favor this approach and support his decision.  In all likelihood with his  other comorbid conditions, a tissue valve will last him the rest of his  lifetime without any difficulty.  I have also made it clear to Cody Le  and his wife that valve replacement will  only be the first  step in terms of  his long-term health issues.  He needs to lose weight significantly and this  will likely greatly improve his ability to monitor and maintain strict blood  sugar control and potentially alleviate his associated symptoms and side  effects of obstructive sleep apnea.  All other questions have been  addressed.  We tentatively plan to proceed with surgery on Wednesday,  May 01, 2003.  Cody Le and his wife understand and accept all  associated risks of surgery, including, but not limited to the risk of  death, stroke, myocardial infarction, congestive heart failure, respiratory  failure, pneumonia, bleeding requiring blood transfusion, arrhythmia, heart  block, or bradycardia requiring permanent pacemaker, recurrent valvular  heart problems or abnormalities.  All other questions have been addressed.                                                Salvatore Decent. Cornelius Moras, M.D.   CHO/MEDQ  D:  04/22/2003  T:  04/22/2003  Job:  [id number]   cc:   Learta Codding, M.D.   Weyman Pedro, M.D.   Short Stay Area

## 2010-08-14 NOTE — Discharge Summary (Signed)
NAMEJAMARKUS, LISBON                             ACCOUNT NO.:  [id number]   MEDICAL RECORD NO.:  [id number]                   PATIENT TYPE:  INP   LOCATION:  2023                                 FACILITY:  [facility]   PHYSICIAN:  Salvatore Decent. Cornelius Moras, M.D.              DATE OF BIRTH:  02/02/1933   DATE OF ADMISSION:  DATE OF DISCHARGE:  05/08/2003                                 DISCHARGE SUMMARY   PRIMARY ADMITTING DIAGNOSIS:  Severe aortic stenosis.   ADDITIONAL DISCHARGE DIAGNOSES:  1. Severe aortic stenosis.  2. Postoperative atrial fibrillation and atrial flutter.  3. Pericarditis with pericardial effusion, status post percutaneous drainage     in August of 2004.  4. Hypertension.  5. Hyperlipidemia.  6. Type 2 non-insulin dependent diabetes mellitus.  7. Obesity.  8. Obstructive sleep apnea.   PROCEDURES PERFORMED:  Aortic valve replacement with a #23 Carpentier-  Edwards Magnum pericardial aortic valve.   HISTORY:  The patient is a 77 year old white male with a known history of  aortic stenosis.  He initially presented in August of 2004 with shortness of  breath, fatigue, weakness and presyncope.  At that time, he was found to  have a pericardial effusion with early tamponade, and was brought emergently  to the catheterization laboratory for pericardiocentesis as well as cardiac  catheterization.  He apparently developed atrial fibrillation with  hemodynamic instability during the coronary artery injection.  He stabilized  rapidly after drainage of his effusion.  Pericardial fluid was ultimately  found to be benign, and his pericarditis was felt to be viral.  At the time  of catheterization, he was noted to have moderate-to-severe aortic stenosis.  He did well and was discharged home.  However, over the next several months,  he developed recurrent symptoms of exertional shortness of breath and  fatigue.  He returned to see Dr. Learta Codding for followup in December and  was  brought in for repeat cardiac catheterization on December 14.  At that  time, he was noted to have severe aortic stenosis with preserved left  ventricular function, and no significant coronary artery disease.  His peak  and mean aortic transvalvular gradients were 40 and 32 mmHg respectively  with an aortic valve area of 0.7 sq. cm.  This translated into an aortic  valve index of 0.31.  His right heart pressures were mildly elevated with PA  pressure mean of 23 mmHg and a pulmonary capillary wedge pressure of 17.  His left ventricular end-diastolic pressure was 16 with an ejection fraction  of 65%.  Because of these findings and his worsening symptoms, he was  referred to Dr. Marilu Favre Own for consideration of aortic valve replacement.  Dr. Cornelius Moras agreed that the patient would be best served with replacement of  his valve at this time.  After explanation of the risks, benefits and  alternatives, the patient agreed to proceed.  HOSPITAL COURSE:  He was admitted to [facility] on May 02, 2003, and was taken to the operating room where he underwent aortic valve  replacement by Dr. Cornelius Moras as described in detail above.  He tolerated the  procedure well and was transferred to the SICU in stable condition.  He was  able to be extubated shortly after surgery.  He was hemodynamically stable  and neurologically intact overall on postoperative day #1.  He was started  on Coumadin for anticoagulation for his valve.  His hemodynamic monitor  devices and lines were removed as well as his chest tube.  He was mobilized.  He was able to be transferred to the floor later that day.  He was initially  maintained on the glucometer protocol for his blood sugars.  Once his p.o.  intake improved, he was started on Glucotrol as well as Lantus insulin in  the evening.  He was somewhat volume overloaded and was started on Lasix as  well as potassium for diuresis.  His blood sugars remained elevated despite   the restart of his Glucotrol.  Therefore, his Glucophage was restarted as  his renal function had remained stable.  He was titrated upward to his home  doses of both medications.  Despite the increases, his blood sugars have  remained elevated.  He was seen by the diabetes teaching coordinator.  His  hemoglobin A1c was around 7.6.  Some recommendations were made regarding his  diabetes management.  Dr. Cornelius Moras started him on Avandia 4 mg daily, and  discontinued his Lantus.  Since that time, his sugars have stabilized and  have remained in the 120 to 150 range.  His only postoperative complication  has been an episode of atrial fibrillation/ atrial flutter.  This occurred  on postoperative day #3, with elevated rates.  He was started on Cardizem  drip as well as amiodarone per protocol.  He converted quickly to normal  sinus rhythm.  By postoperative day #4, he was able to switch to p.o.  amiodarone and the Cardizem was discontinued.  He was also continued on  Lopressor, and the dosage has been titrated upward as his blood pressures  have tolerated.  Since that time, he has remained afebrile and all vital  signs have been stable.  He has maintained normal sinus rhythm with the  exception of one brief run of supraventricular tachycardia.  His blood  pressure has been stable, running in the 120 to 130 systolic range.  His  surgical incision sites were all healing well.  He has been diuresing well  with the Lasix and is almost back down to his preoperative weight.  His  anticoagulation has been ongoing.  His INR presently is 1.9 with PT of 18.9.  His pacing wires have now been removed.  His most recent labs show potassium  of 3.7 which has been supplemented, BUN 27, creatinine 1.2, hemoglobin 10.2,  hematocrit 29.7, white blood count 12.8, platelets 100.  His surgical  incision sites were all healing well.  He has been ambulating in the hall with cardiac rehabilitation phase I as well as  independently, and is doing  very well.  He is being treated with aggressive pulmonary toilet measures  and has been weaned from supplemental oxygen, maintaining O2 saturations of  greater than 90% on room air.  It is felt that if he continues to remain  stable over the next 24 hours, he should hopefully be ready for discharge  home  on May 08, 2003.   DISCHARGE MEDICATIONS:  1. Coumadin, home dose to be determined by PT/INR draw on the day of     discharge.  2. Toprol XL 75 mg daily.  3. Amiodarone 400 mg daily x2 weeks, then 200 mg daily.  4. Avandia 4 mg daily.  5. Lipitor 40 mg daily.  6. Metformin 1500 mg q.a.m. and 1000 mg q.h.s.  7. Glipizide 20 mg b.i.d.  8. Prevacid 30 mg daily.  9. Ultram 50-100 q.4-6h. p.r.n. for pain.   DISCHARGE INSTRUCTIONS:  1. He is to refrain from driving, heavy lifting or strenuous activity.  He     may continue daily ambulation and use of his incentive spirometer.  He is     asked to shower daily and clean his incisions with soap and water.  2. He will continue a low-fat, low-sodium diabetic diet.   DISCHARGE FOLLOWUP:  1. He will see Dr. Learta Codding in the office in two weeks, and have a     chest x-ray at that visit.  2. He will have a PT/INR drawn on Friday at the [facility], and     they will manage his Coumadin dosing accordingly.  3. He will then follow up with Dr. Salvatore Decent. Cornelius Moras on Monday, March 7, at     11:30 a.m.  He will bring his chest     x-ray to this visit.  4. He is also asked to make an appointment in the next one to two weeks with     Dr. Eliberto Ivory, his primary care physician, to reevaluate his oral     hyperglycemic medication regimen.      Coral Ceo, P.A.                        Salvatore Decent. Cornelius Moras, M.D.    GC/MEDQ  D:  05/07/2003  T:  05/08/2003  Job:  [id number]

## 2010-08-14 NOTE — Assessment & Plan Note (Signed)
[facility]                            [facility] NOTE   AVON, MERGENTHALER                        MRN:          [id number]  DATE:01/12/2006                            DOB:          [birth year]    REFERRING PHYSICIAN:  Dr. Eliberto Ivory   HISTORY OF PRESENT ILLNESS:  The patient is a 77 year old male with a  history of aortic stenosis, status post aortic valve replacement with #23  pericardial tissue valve [Carpentier-Edwards].  The patient was recently  hospitalized due to concerns of congestive heart failure.  The patient  developed fluid retention and lower extremity edema.  He also has developed  some worsening renal insufficiency.  He was ultimately referred for cardiac  catheterization to rule out constrictive pericarditis.  This catheterization  was not consistent with this diagnosis.  He did have a significant V wave up  to 39 mmHg, and we performed in followup a PE.  The patient was not found to  have significant mitral regurgitation.  He also only had mild pulmonary  hypertension, and there was no definite gradient across the aortic valve  suggestive of aortic stenosis.   The patient has been placed on diuretic therapy and has lost some weight,  but still has significant peripheral edema.  He is markedly deconditioned  and is not particularly compliant with his fluid intake.   He denies any substernal chest pain.  He has no orthopnea or PND.   MEDICATIONS:  1. Enteric-coated aspirin 325 mg daily.  2. Actos 45 mg p.o. daily.  3. Prevacid 30 mg a day.  4. Toprol XL 100 mg p.o. daily.  5. Lasix 80 mg p.o. b.i.d.  6. Levemir 6 units subcu daily.  7. Felodipine 10 mg p.o. daily.   PHYSICAL EXAMINATION:  VITAL SIGNS:  Blood pressure 130/68, heart rate is 72  beats per minute, weight is 339.8 pounds.  Overweight white male in no apparent distress.  HEENT:  Pupils nonicteric, throat clear.  NECK:  Supple, normal carotid upstroke, no  carotid bruits.  LUNGS:  Clear.  HEART:  Regular rate and rhythm, normal S1, S2.  ABDOMEN:  Soft.  EXTREMITIES:  3+ peripheral pitting edema.   EKG demonstrates normal sinus rhythm, no acute ischemic changes.   PROBLEM LIST:  1. Lower extremity edema.      a.     Chronic.      b.     Rule out secondary to vasodilator drug therapy [felodipine].      c.     Volume overload.      d.     Sharlene Motts out for constrictive pericarditis.  2. Chronic renal insufficiency, currently stable at 2.0.  3. V wave on hemodynamics.      a.     No definite significant mitral regurgitation.      b.     Diastolic dysfunction.  4. Obstructive sleep apnea, on continuous positive airway pressure.  5. Benign prostatic hypertrophy, followed by Dr. Eliberto Ivory.  6. Status post #23 pericardial tissue valve, Carpentier-Edwards.   PLAN:  1. The patient continues  to be volume overloaded.  I left him on his      current dose of Lasix.  His creatinine is stable.  2. There is no evidence of significant aortic stenosis that relates to his      pericardial tissue valve.  3. We will ask Dr. Eliberto Ivory to obtain a BMET every 6 months to follow up on      the patient's renal insufficiency.  4. I have asked the patient to enroll in a cardiac rehabilitation program,      and also consider stopping felodipine as it may be contributing to the      lower extremity edema.       Learta Codding, MD,FACC     GED/MedQ  DD:  01/13/2006  DT:  01/15/2006  Job #:  [id number]   cc:   Dr. Eliberto Ivory

## 2010-08-14 NOTE — Cardiovascular Report (Signed)
   NAMEAVELARDO, REESMAN                             ACCOUNT NO.:  [id number]   MEDICAL RECORD NO.:  [id number]                   PATIENT TYPE:  INP   LOCATION:  2925                                 FACILITY:  [facility]   PHYSICIAN:  Carole Binning, M.D. [facility]         DATE OF BIRTH:  [birth year]   DATE OF PROCEDURE:  11/19/2002  DATE OF DISCHARGE:                              CARDIAC CATHETERIZATION   PROCEDURE PERFORMED:  Selective coronary angiography to the left main  coronary artery with administration of intracoronary nitroglycerin.   INDICATIONS:  Mr. Wulf is a 77 year old male who presented last week with  pericardial effusion and cardiac tamponade.  He had a cardiac  catheterization done at that time which showed possible disease in the  ostium of the left main coronary artery.  However, the patient became  hemodynamically unstable requiring emergent pericardiocentesis.  It was  therefore not possible to give nitroglycerin to relieve potential vasospasm  and further investigate the disease in the origin of the left main coronary  artery.  After stabilization the patient was brought back to the laboratory  today to reassess his left main coronary artery.   PROCEDURAL NOTE:  A 6-French sheath was placed in the right femoral artery.  Coronary angiography was performed with 6-French JL4 catheter.  Intracoronary nitroglycerin was administered prior to coronary angiography.  Contrast was Omnipaque.  There were no complications.  Of note, we had  discussed whether to do an intravascular ultrasound.  However, because of  the serosanguineous pericardial effusion and risk of anticoagulation, we  opted not to perform intravascular ultrasound.  There were no complications.   RESULTS:  HEMODYNAMICS:  Central aortic pressure is 134/78.   CORONARY ARTERIOGRAPHY:  The left main coronary artery has an eccentric plaque which appears to be  approximately 30% stenosis in its severity in the worst  view which is the AP  cranial view.  In the shallow LAO view as well as RAO view and LAO caudal  view there does not appear to be any significant disease in the left main  coronary artery.   IMPRESSION:  Eccentric approximately 30% stenosis in the ostium of the left  main coronary artery.  This does not appear to be hemodynamically  significant.   PLAN:  The patient will be managed medically for his coronary artery  disease.                                               Carole Binning, M.D. [facility]   MWP/MEDQ  D:  11/19/2002  T:  11/19/2002  Job:  [id number]   cc:   Weyman Pedro, M.D.   [facility]

## 2010-08-14 NOTE — Cardiovascular Report (Signed)
Cody Le, Cody Le                             ACCOUNT NO.:  [id number]   MEDICAL RECORD NO.:  [id number]                   PATIENT TYPE:  INP   LOCATION:  2925                                 FACILITY:  [facility]   PHYSICIAN:  Salvadore Farber, M.D.             DATE OF BIRTH:  [birth year]   DATE OF PROCEDURE:  11/15/2002  DATE OF DISCHARGE:                              CARDIAC CATHETERIZATION   PROCEDURE:  Pericardiocentesis.   INDICATIONS:  Mr. Gill is a 77 year old gentleman who presented with  shortness of breath.  Echocardiogram performed today demonstrated moderate  circumferential pericardial effusion with evidence of tamponade physiology.  He was urgently transferred from [facility] to [facility]  for right and left heart catheterization.  This was performed by Jonelle Sidle, M.D. [facility].  During the procedure he decompensated hemodynamically  in the setting of new onset atrial fibrillation.  Systolic blood pressure  dropped acutely from 150 to less than 60.  I was asked to perform emergent  pericardiocentesis.   PROCEDURAL TECHNIQUE:  The patient was apprised of the emergent nature of  the indication for pericardiocentesis and agreed to proceed.  The subxiphoid  area was anesthetized using 1% lidocaine.  Echocardiogram was performed in  the room to confirm the size and location of the pericardium.  However,  direct echocardiographic guidance was not utilized.  Fluoroscopic guidance  was used with monitoring of the electrocardiogram via the needle.  The  pericardium was entered.  There was brisk return of bloody fluid from the  pericardium.  The track was dilated and a multi-holed catheter was  positioned within the pericardium.  Approximately 500 mL of bloody fluid was  drained from the pericardium.  Emergent hematocrit on this fluid was  obtained and found to be less than 10.  A small amount of IV contrast was  placed in the pericardium to confirm  position of the catheter.  Drainage of  the effusion resulted in improvement in the blood pressure with systolic  rising from less than 60 to approximately 130 mm.  As detailed in Jonelle Sidle, M.D. [facility] accompanying note, there was residual elevation in  intrapericardial pressures suggestive of some element of loculated effusion  which was not well drained.  Echocardiogram demonstrated that the effusion  was, however, much improved as obviously was his hemodynamic situation.  Fluid was sent for cell counts, LDH, cytology, aerobic and anaerobic  cultures, fungal cultures, AFB.   COMPLICATIONS:  None.   IMPRESSION/RECOMMENDATION:  Successful pericardiocentesis resulting in  immediate improvement in hemodynamic status.  There is residual elevation  intrapericardial pressure suggestive of some element of loculated effusion.  There is also residual elevation in the right atrial pressure above that of  the pericardium suggestive of diffuse constrictive disease.   Will continue to drain the pericardium overnight and repeat echocardiogram  in the morning.  Await diagnostic  studies.  Given the bloody nature of the  fluid, will need to pursue an aggressive evaluation to exclude malignancy as  etiology.                                                Salvadore Farber, M.D.    WED/MEDQ  D:  11/15/2002  T:  11/16/2002  Job:  [id number]   cc:   Learta Codding, M.D.  [address]  [address]  [place]  [place] [address]

## 2010-08-14 NOTE — Op Note (Signed)
Cody Le, Cody Le                             ACCOUNT NO.:  [id number]   MEDICAL RECORD NO.:  [id number]                   PATIENT TYPE:  INP   LOCATION:  2301                                 FACILITY:  [facility]   PHYSICIAN:  Salvatore Decent. Cornelius Moras, M.D.              DATE OF BIRTH:  01-13-1933   DATE OF PROCEDURE:  05/02/2003  DATE OF DISCHARGE:                                 OPERATIVE REPORT   PREOPERATIVE DIAGNOSIS:  Severe aortic stenosis.   POSTOPERATIVE DIAGNOSIS:  Severe aortic stenosis.   PROCEDURE:  Median sternotomy for aortic valve replacement (#23 mm Edwards  pericardial tissue bioprosthetic valve, model number 3000, serial number  [id number]).   SURGEON:  Salvatore Decent. Cornelius Moras, M.D.   ASSISTANT:  Gwenith Daily. Tyrone Sage, M.D.   ANESTHESIA:  General.   INDICATIONS FOR PROCEDURE:  The patient is a 77 year old obese white male  with history of aortic stenosis, hypertension, type 2 diabetes mellitus,  hyperlipidemia, and obstructive sleep apnea.  He originally presented in  August of 2004 with shortness of breath, fatigue, and presyncope. He was  noted to have pericardial effusion with early tamponade physiology.  He  subsequently underwent pericardiocentesis with discovery of bloody  pericardial effusion consistent with acute pericarditis.  He was also  diagnosed with moderate to severe aortic stenosis at that time.  Since then,  he has been followed by Learta Codding, M.D.  He has continued to develop  progressive symptoms of exertional shortness of breath and fatigue.  Two-  dimensional echocardiogram confirms the progression of aortic stenosis, and  left and right heart catheterization also confirmed the presence of severe  aortic stenosis with estimated aortic valve area of 0.7 square cm.  The  patient was referred for possible elective aortic valve replacement.   OPERATIVE CONSENT:  The patient and his wife had been counseled at length  regarding the indications and potential  benefits of aortic valve  replacement.  Alternative treatment strategies have been discussed.  They  understand and accept all associated risks of surgery including, but not  limited to risk of death, stroke, myocardial infarction, congestive heart  failure, respiratory failure, pneumonia, bleeding requiring blood  transfusion, arrhythmia, heart block or bradycardia requiring permanent  pacemaker, prosthetic valve related complications.  They have also been  counseled regarding the relative risks and benefits of longterm  anticoagulation of Coumadin and the relative risks and benefits of  mechanical valve replacement versus tissue bioprosthetic valve.  All of  their questions have been addressed.  The patient desires to proceed with  surgery as described and he specifically requests that a tissue  bioprosthetic valve be placed in an effort to avoid the need for longterm  anticoagulation.   DESCRIPTION OF PROCEDURE:  The patient is brought to the operating room on  the above mentioned date and central monitoring is established by the  anesthesia service under the care and  direction of Judie Petit, M.D.  Specifically, a Swan-Ganz catheter is placed through the right internal  jugular approach.  A radial arterial line is placed.  Intravenous  antibiotics are administered.  Following induction with general endotracheal  anesthesia, a Foley catheter is placed.   Baseline transesophageal echocardiogram is performed by Dr. Randa Evens.  This  confirms the presence of severe aortic stenosis.  The aortic valve appears  to be tricuspid and all three leaflets are severely calcified and immobile.  There is trace aortic insufficiency.  There is mild mitral regurgitation.  Left ventricular systolic function appears normal.  There is moderate left  ventricular hypertrophy.  No other significant abnormalities are identified.   The patient's chest, abdomen, both groins, and both lower extremities are   prepped and draped in the usual sterile fashion.  A median sternotomy  incision is performed. The pericardium is opened.  There are severe  adhesions throughout the pericardial space consistent with the patient's  known previous diagnosis of pericarditis.  Sharp dissection is performed to  free up the structures of the anterior mediastinum including the ascending  aorta, the right atrium, the anterior surface of the left ventricle.  The  patient is heparinized systemically.   The ascending aorta is fairly normal in appearance and without any palpable  plaques or calcifications.  The ascending aorta is somewhat foreshortened.  The aorta is subsequently cannulated just beyond the takeoff of the  innominate artery.  A two-stage venous cannula is placed through the right  atrium.  A retrograde cardioplegia catheter is placed through the right  atrium into the coronary sinus.  Adequate heparinization is verified.  Cardiopulmonary bypass is begun.  A left ventricular vent is placed through  the right superior pulmonary vein.  A cardioplegia catheter is placed in the  ascending aorta.  A temperature probe is placed in the left ventricular  septum.   The patient is cooled to 32 degrees systemic temperature.  The aortic  crossclamp is applied and cardioplegia is delivered initially in an  antegrade fashion through the aortic root.  Iced saline slush is applied for  topical hypothermia.  Supplemental cardioplegia is administered retrograde  through the coronary sinus catheter.  The initial cardioplegic arrest and  myocardial cooling are felt to be excellent.  Repeat doses of cardioplegia  are administered intermittently throughout the crossclamped portion of the  operation through the retrograde coronary sinus catheter to maintain septal  temperature below 15 degrees cGy.   An oblique transverse aortotomy is performed.  The aortic valve is infected and is notably heavily calcified and stenotic.   The aortic valve is  tricuspid.  The left main and the right coronary artery are in the usual  locations.  There is mild calcification within the aortic root.  The aortic  valve is incised sharply.  The aortic annulus is decalcified.  This is  technically straight forward.  The aortic annulus is now sized and will  accept a 23 mm Carpentier-Edwards tissue bioprosthetic valve.  Aortic  annulus and left ventricle and aortic root are all irrigated with iced  saline solution.   The aortic valve replacement is performed using interrupted 2-0 Ethibond  horizontal mattress pledgeted sutures with pledgets in the subannular  position.  A 23 mm Edwards pericardial magnum series valve is secured in  place uneventfully.  After completion of valve replacement, the valve is  inspected circumferentially and appears to be well seated.  The left main  and the right coronary  arteries are both well away from the sewing ring of  the valve.  Rewarming is begun.  The valve is lavaged with iced saline  solution.  The aortotomy is now closed using a standard two-layer closure of  running 4-0 Prolene suture.  The patient is placed in Trendelenburg  position.  One final dose of warm retrograde Hotshot cardioplegia is  administered.  The lungs are ventilated and the heart allowed to fill to  evacuate any residual air from the pulmonary veins and the left heart  circulation.  The aortic crossclamp is removed after a total crossclamp time  of 85 minutes.   The heart begins to beat spontaneously without need for cardioversion.  The  retrograde cardioplegia catheter is removed.  Epicardial pacing wires are  fixed to the right ventricular outflow tract and to the right atrial  appendage.  The left ventricular vent is removed.  The aortic root vent is  left in place to facilitate evacuation of any residual air during rewarming  and weaning and separation from bypass.   The patient is rewarmed to 37 degrees cGy  temperature.  The patient is now  weaned from cardiopulmonary bypass without difficulty.  The patient's rhythm  at separation from bypass is sinus bradycardia.  Atrial pacing is employed  to increase the heart rate.  The patient is weaned from bypass on low dose  dopamine infusion.  Shortly after separation from bypass, the patient  develops hyperdynamic contractility and the Dopamine infusion is  discontinued.  Total cardiopulmonary bypass time for the operation is 114  minutes.  The venous cannula is removed.  The aortic root vent is removed.   Follow-up transesophageal echocardiogram performed by Dr. Randa Evens after  separation from bypass demonstrates preserved left ventricular function.  The aortic valve prosthesis appears to be in normal position and functioning  appropriately.  There is no sign of any sort of aortic insufficiency  whatsoever.  There is insignificant residual air.  The aortic cannula is removed.  Protamine is administered to reverse the  anticoagulation.  The mediastinum is irrigated with saline solution  containing Vancomycin.  Meticulous surgical hemostasis is ascertained.  The  mediastinum is now drained using two 36 French chest tubes placed through  separate stab incisions inferiorly.  The median sternotomy is closed using  double strength sternal wire for the closure.  The sternotomy incision is  reclosed in multiple layers and the skin incision closed with a subcuticular  skin closure.   The patient tolerated the procedure well and is transported to the surgical  intensive care unit in stable condition.  There are no intraoperative  complications.  All needle, sponge, and instrument counts correct at  completion of the operation.  No blood products were administered.                                               Salvatore Decent. Cornelius Moras, M.D.    CHO/MEDQ  D:  05/02/2003  T:  05/02/2003  Job:  [id number]   cc:   Learta Codding, M.D.   Nena Jordan

## 2010-08-14 NOTE — Cardiovascular Report (Signed)
Cody Le, Cody Le                             ACCOUNT NO.:  [id number]   MEDICAL RECORD NO.:  [id number]                   PATIENT TYPE:  INP   LOCATION:  2925                                 FACILITY:  [facility]   PHYSICIAN:  Jonelle Sidle, M.D. [facility]        DATE OF BIRTH:  09-13-1932   DATE OF PROCEDURE:  DATE OF DISCHARGE:                              CARDIAC CATHETERIZATION   PRIMARY CARE PHYSICIAN:  Nena Jordan, M.D.   CARDIOLOGIST:  Learta Codding, M.D.   INDICATIONS:  Cody Le is a 77 year old male with a history of  hypertension, type 2 diabetes mellitus, and dyslipidemia who presents with  recent complaints of chest discomfort and fatigue as well as dyspnea.  He  has been found to have a moderate to large circumferential pericardial  effusion with echocardiographic findings suggestive of tamponade physiology.  He is also noted to have at least moderate aortic stenosis.  He is referred  for coronary angiography as well as hemodynamic assessment to help guide  further therapy.   PROCEDURES PERFORMED:  1. Left heart catheterization.  2. Right heart catheterization.  3. Left ventriculography.  4. Selective coronary angiography.   ACCESS AND EQUIPMENT:  The area about the right femoral artery and vein was  anesthetized with 1% lidocaine and a 6-French sheath was placed in the right  femoral artery via the modified Seldinger technique.  An 8-French sheath was  placed in the right femoral vein via the modified Seldinger technique.  Standard preformed 6-French JL4 and JR4 catheters were used for left heart  catheterization, left ventriculography.  The JR4 with a straight wire was  used to cross the aortic valve with exchange for a pigtail catheter for left  ventriculography and pullback measurements.  A standard 7.5-French balloon  tip catheter was used for right heart catheterization and hemodynamic  assessment.  All exchanges were made over a wire and the patient  tolerated  the procedure well.  He did go into atrial fibrillation with rapid  ventricular response during injection of the coronaries and initially  remained hemodynamically stable.  He received Lopressor and was ultimately  placed on amiodarone.   HEMODYNAMICS:  1. Right atrium 24/21.  2. Right ventricle 42/18.  3. Pulmonary artery 35/28.  4. Pulmonary capillary wedge pressure 25/23.  5. Left ventricle 150/23.  6. Aorta 118/73.  7. Cardiac output 5.1.  8. Cardiac index 2.1.  9. Aortic valve peak gradient 32, mean gradient 24.  10.      Aortic valve area calculates at 1.0.  11.      Pulmonary artery saturation 58%.  12.      Arterial saturation 94%.    ANGIOGRAPHIC FINDINGS:  1. The left main coronary artery shows an approximate 60% ostial stenosis     noted on some views with catheter ventricularization noted on all     injections.  2. The left anterior descending is a  medium caliber vessel with a 40%     proximal stenosis and 30% mid vessel stenosis.  3. The circumflex coronary artery is a large vessel supplying most of the     lateral wall with three obtuse marginal branches.  There is a 40% mid     vessel stenosis noted.  4. The right coronary artery is a dominant vessel with minor luminal     irregularities, but no major obstructive stenoses.   LEFT VENTRICULOGRAPHY:  Left ventriculography is performed in the RAO  projection revealing an ejection fraction of 60-65% with 1+ mitral  regurgitation.  No obvious focal wall motion abnormalities are noted.   DIAGNOSES:  1. Left main coronary artery disease as described with ventricularization on     catheter injections.  2. Left ventricular ejection fraction of 60-65%.  3. Moderate aortic stenosis with a calculated valve area of 1.0 sq cm.  4. Right heart hemodynamics consistent with tamponade physiology,     particularly based on history of moderate to large circumferential     pericardial effusion.  5. New onset atrial  fibrillation with rapid ventricular response during the     procedure.  The patient is now on Lopressor and amiodarone and     hemodynamically stable at completion of the procedure.   PLAN:  Will discuss the films in detail with Cody Le, M.D. and  plan to involve CVTS in terms of timing of potential surgery and aortic  valve replacement.  In terms of the pericardial effusion, will also need to  address this either via pericardiocentesis or potentially window at time of  surgery.  Hemodynamics will obviously help guide this decision, particularly  with the new onset atrial fibrillation.                                               Jonelle Sidle, M.D. [facility]    SGM/MEDQ  D:  11/15/2002  T:  11/16/2002  Job:  [id number]

## 2010-08-14 NOTE — H&P (Signed)
Cody Le, Cody Le                             ACCOUNT NO.:  [id number]   MEDICAL RECORD NO.:  [id number]                   PATIENT TYPE:  JCAR   LOCATION:                                       FACILITY:  [facility]   PHYSICIAN:  Learta Codding, M.D.                 DATE OF BIRTH:  Dec 19, 1932   DATE OF ADMISSION:  03/14/2003  DATE OF DISCHARGE:                                HISTORY & PHYSICAL   CHIEF COMPLAINT:  Shortness of breath and fatigue.   HISTORY OF PRESENT ILLNESS:  A 77 year old male followed primarily by Dr.  Andee Lineman, who was admitted to [facility] on  November 15, 2002 with weakness, fatigue, and presyncope.  Found to have a  pleural effusion which was consistent with, both by echocardiography and  catheterization with tamponade physiology.  He was brought emergently to  cardiac catheterization and lab and had left-to-right heart catheterization.  At the time, he was felt to have a 60% left main lesion as well as moderate  aortic stenosis.  Right heart pressures were consistent with tamponade  physiology.  During ejection of the coronary arteries, the patient went into  new-onset atrial fib and became hemodynamically unstable.  He required an  emergent pericardiocentesis.  Serosanguineous fluid was removed.  A  pericardial drain remained in place for 48 hours, and the patient remained  hemodynamically improved at a rapid response; however, it was not possible  at the time of his initial catheterization to further evaluate left main  coronary region.  Ultimately, he underwent repeat catheterization on November 19, 2002 by Dr. Gerri Spore after pretreatment with intracoronary  nitroglycerin.  The patient was found to have eccentric 30% stenosis in the  ostium of the left main coronary artery, but this had not appeared to be  hemodynamically significant.  In addition, his aortic valve was further  evaluated with a transesophageal echocardiogram on November 21, 2002.  He was  felt to  have no ejection fraction with a peak gradient of 35, and his  findings were consistent with a moderate aortic valve stenosis.  It was also  felt that there was increased thickening of the pericardium.  No definitely  etiology of the patient's pleural effusion was ever established via  pathology and cytology.  The patient therefore was found to have a probable  viral pericarditis with bloody effusion.  He was seen back in the office  initially and was feeling better; however, on repeat follow-up visit on  March 05, 2003, he was complaining of fatigue.  His wife was complaining  all he does is sleep a lot or sit around, he won't do anything.  He was  not having any chest pain or shortness of breath at rest, but he had marked  dyspnea on exertion.  He had no syncope, but he was presyncopal at times.  He had no PND.  He did have some orthopnea.  I discussed with Dr. Andee Lineman.  The plans were to discontinue his hydrochlorothiazide and recheck a 2D  echocardiogram.  He felt he had physiology that possibly was compatible with  constrictive pericarditis and/or worsening aortic stenosis.  A 2D  echocardiogram done on March 05, 2003 showed preserved LV contraction  without segmental wall motion abnormalities; however, he had severe aortic  stenosis with increase in outflow velocity compared to an exam from August,  2004.  His valve area calculated to 0.8 cm squared, an increase of velocity  greater than 0.3 meters per second per year, suggesting rapidly worsening  aortic stenosis.  He had normal RV size and contraction.  There was no  significant residual pericardial effusion.  Plans were to have the patient  proceed with right and left heart catheterization, and he may well require  aortic valve surgery at this point.   CURRENT MEDICATIONS:  1. Glipizide 10 mg b.i.d.  2. Lipitor 10 mg q.d.  3. Metformin 500 mg 3 tablets b.i.d.  4. Mavik 4 mg b.i.d.  5. Hydrochlorothiazide, on hold, 25 mg  q.d.  6. Lopressor 50 mg 1/2 tablet b.i.d.  7. Nitroglycerin p.r.n.   ALLERGIES:  No known drug allergies.   PAST MEDICAL HISTORY:  1. Positive for chest discomfort with catheterization results as described     above.  2. Recent viral pericarditis with bloody effusion, status post tamponade     physiology.  Status post pericardiocentesis.  3. Paroxysmal atrial fib with conversion to normal sinus rhythm.  4. Severe aortic stenosis.  5. Diabetes mellitus.  6. Obesity.  7. Hypertension.  8. Metabolic syndrome.   FAMILY HISTORY:  Notable for coronary artery disease.   SOCIAL HISTORY:  The patient lives near [place], [place] area.  He does not  drink.  He is married.  This is his second marriage.  He does not smoke  tobacco.   REVIEW OF SYSTEMS:  As per HPI.   PHYSICAL EXAMINATION:  VITAL SIGNS:  Weight is 304.4, pulse 78, blood  pressure 144/86.  GENERAL:  A large white male in no acute distress.  NECK:  No JVD.  Carotids are diminished in upstroke bilaterally with  transmitted murmur bilaterally.  LUNGS:  Clear without rales.  HEART:  Heart sounds are regular.  Normal S1.  S2 is not heard well, but he  does have a hard crescendo/decrescendo murmur in the aortic area that  radiates into the neck.  ABDOMEN:  Soft and obese.  Bowel sounds are present.  No bruits are heard.  EXTREMITIES:  Trace ankle edema.   IMPRESSION:  1. Severe aortic stenosis.  2. Diabetes mellitus.  3. Hypertension.  4. Dyslipidemia.  5. Recent viral pericarditis with bloody effusion.  6. Coronary artery disease documented by 30% eccentric stenosis to the     ostium of the left main, which did not appear to be hemodynamically     significant on repeat heart catheterization.  7. Obesity.   PLAN:  He will be admitted to the hospital for elective right and left heart  catheterization.  May require aortic valve replacement.     Suszanne Conners. Julious Oka.                    Learta Codding, M.D.     MRD/MEDQ  D:  03/12/2003  T:  03/12/2003  Job:  [id number]

## 2010-10-02 ENCOUNTER — Encounter: Payer: Self-pay | Admitting: Cardiology

## 2011-03-10 ENCOUNTER — Ambulatory Visit (INDEPENDENT_AMBULATORY_CARE_PROVIDER_SITE_OTHER): Payer: Medicare Other | Admitting: Cardiology

## 2011-03-10 ENCOUNTER — Encounter: Payer: Self-pay | Admitting: Cardiology

## 2011-03-10 VITALS — BP 117/70 | HR 56 | Ht 72.0 in | Wt 283.0 lb

## 2011-03-10 DIAGNOSIS — I509 Heart failure, unspecified: Secondary | ICD-10-CM

## 2011-03-10 DIAGNOSIS — I10 Essential (primary) hypertension: Secondary | ICD-10-CM

## 2011-03-10 DIAGNOSIS — I5032 Chronic diastolic (congestive) heart failure: Secondary | ICD-10-CM

## 2011-03-10 DIAGNOSIS — I059 Rheumatic mitral valve disease, unspecified: Secondary | ICD-10-CM

## 2011-03-10 DIAGNOSIS — Z954 Presence of other heart-valve replacement: Secondary | ICD-10-CM

## 2011-03-10 NOTE — Assessment & Plan Note (Signed)
Patient has a Oceanographer, bioprosthetic. Postoperatively he always had a murmur. This appears to be stable but the patient has not had an echocardiogram in quite some time. Also given his recent hip abscess, I will order an echocardiogram to make sure that valve integrity is within normal limits.

## 2011-03-10 NOTE — Assessment & Plan Note (Signed)
Blood pressure is well controlled. No adjustments in medications are required

## 2011-03-10 NOTE — Progress Notes (Signed)
Cody Bottoms, MD, [facility] Board Certified in Adult Cardiovascular Medicine,Internal Medicine and Critical Care Medicine    CC: Followup patient with history of aortic valve replacement  HPI:  The patient is a 77 year old male with a history of Carpentier-Edwards pericardial tissue valve, obstructive sleep apnea and diastolic heart failure as well as significant dementia. Recently his diuretics were discontinued I suspect because his renal function. However he has rapidly gained weight with an increase of 12 pounds over just days. His wife reports significant lower extremity edema. The patient's dementia is quite severe and he does not do any activities anymore, and doesn't watch TV and pretty much sits in his chair all day long. He denies however any chest pain shortness of breath orthopnea or PND. He also reports no palpitations.     PMH: reviewed and listed in Problem List in Electronic Records (and see below) Past Medical History  Diagnosis Date  . Syncope     hx, but no recurrence  . Observed sleep apnea   . Dementia   . Diabetes mellitus   . Dyslipidemia   . Kidney disease     with creatinine of 1.5  . Diastolic heart failure   . HLD (hyperlipidemia) Status post hip abscess status post incision and drainage     Past Surgical History  Procedure Date  . Tonsillectomy   . Percutaneous drainage of pericardia leffusion aug 2004  . Aortic valve replacement       Allergies/SH/FHX : available in Electronic Records for review  Medications: Current Outpatient Prescriptions  Medication Sig Dispense Refill  . atorvastatin (LIPITOR) 10 MG tablet Take 10 mg by mouth. Daily at bedtime       . benazepril (LOTENSIN) 40 MG tablet Take 40 mg by mouth daily.        Marland Kitchen donepezil (ARICEPT) 10 MG tablet Take 10 mg by mouth daily.        . felodipine (PLENDIL) 10 MG 24 hr tablet Take 10 mg by mouth daily.        . furosemide (LASIX) 40 MG tablet Take 40 mg by mouth 2 (two) times daily.        Marland Kitchen glipiZIDE (GLIPIZIDE XL) 10 MG 24 hr tablet Take 10 mg by mouth daily.        . lansoprazole (PREVACID) 30 MG capsule Take 30 mg by mouth daily.        . Levamisole HCl POWD 100 Units by Does not apply route as directed.       . memantine (NAMENDA) 10 MG tablet Take 10 mg by mouth 2 (two) times daily.        . metoprolol (TOPROL XL) 50 MG 24 hr tablet Take 50 mg by mouth daily.        . pioglitazone (ACTOS) 45 MG tablet Take 45 mg by mouth daily.        . trandolapril (MAVIK) 4 MG tablet Take by mouth daily.          ROS: No nausea or vomiting. No fever or chills.No melena or hematochezia.No bleeding.No claudication. Significant dementia. Syncope at the time of hip abscess but no recurrence. Wife reports increasing weight and significant edema  Physical Exam: BP 117/70  Pulse 56  Ht 6' (1.829 m)  Wt 283 lb (128.368 kg)  BMI 38.38 kg/m2  SpO2 97% General: Overweight white male in no apparent distress, demented Neck: Normal carotid upstroke and no carotid bruits. JVP is 8 cm. No thyromegaly lung  nodule of thyroid Lungs: Clear breath sounds bilaterally without wheezing Cardiac: Regular rate and rhythm with normal S1-S2 there is a 2/6 crescendo decrescendo murmur both at the right upper second border and left upper sternal border. This murmur was present on previous examinations. There is no S3 or S4 or other pathological murmurs Vascular: 3+ peripheral pitting edema all the way up to the knees. Dorsalis pedis and posterior tibial pulses are palpable Skin: Warm and dry  12lead ECG: Normal sinus rhythm/sinus bradycardia no acute ischemic changes Limited bedside ECHO:N/A   Assessment and Plan

## 2011-03-10 NOTE — Assessment & Plan Note (Signed)
The patient has now acute on chronic diastolic heart failure. Diuretics were discontinued for a while and particularly in the setting of his renal insufficiency I suspect the patient has gained fluid rather rapidly. I asked his wife to increase Lasix to 80 mg in the morning and 40 in the evening for 3 days. Thereafter continue 40 mg twice a day. We will check a BMET in 5-7 days. Depending on his creatinine Lasix dosing may need to be further adjusted. I instructed the patient about decreased fluid intake and daily weights. We will also obtain an echocardiogram to make sure that he does not have additional systolic dysfunction.

## 2011-03-10 NOTE — Patient Instructions (Signed)
   Lasix 80mg  every morning & 40mg  every evening x 3 days only  Resume Lasix 40mg  twice a day thereafter  Echo  Your physician recommends that you go to the [facility] for lab work for [employer] in 1 week. If the results of your test are normal or stable, you will receive a letter.  If they are abnormal, the nurse will contact you by phone. Your physician wants you to follow up in: 6 months.  You will receive a reminder letter in the mail one-two months in advance.  If you don't receive a letter, please call our office to schedule the follow up appointment

## 2011-03-17 ENCOUNTER — Other Ambulatory Visit (INDEPENDENT_AMBULATORY_CARE_PROVIDER_SITE_OTHER): Payer: Medicare Other | Admitting: *Deleted

## 2011-03-17 ENCOUNTER — Other Ambulatory Visit: Payer: Commercial Managed Care - PPO | Admitting: *Deleted

## 2011-03-17 DIAGNOSIS — I2589 Other forms of chronic ischemic heart disease: Secondary | ICD-10-CM

## 2011-03-17 DIAGNOSIS — I059 Rheumatic mitral valve disease, unspecified: Secondary | ICD-10-CM

## 2011-04-01 ENCOUNTER — Other Ambulatory Visit: Payer: Self-pay | Admitting: *Deleted

## 2011-04-01 DIAGNOSIS — Z79899 Other long term (current) drug therapy: Secondary | ICD-10-CM

## 2011-04-01 DIAGNOSIS — R7989 Other specified abnormal findings of blood chemistry: Secondary | ICD-10-CM

## 2011-04-07 ENCOUNTER — Telehealth: Payer: Self-pay | Admitting: *Deleted

## 2011-04-07 NOTE — Telephone Encounter (Signed)
Received call from wife Para March).  States they received letter in mail in reference to abnormal labs.  Advised her that kidney function was elevated & labs would need to be repeated with possible referral to Nephrology.    She states that he already sees nephrologist (Dr. Maggie Font) in [place], [place].  States his is following labs for this & already has follow up appointment scheduled for February 20th.

## 2011-04-09 ENCOUNTER — Encounter: Payer: Self-pay | Admitting: Cardiology

## 2011-04-21 ENCOUNTER — Encounter: Payer: Self-pay | Admitting: *Deleted

## 2011-09-22 ENCOUNTER — Encounter: Payer: Self-pay | Admitting: Cardiology

## 2011-09-22 ENCOUNTER — Ambulatory Visit (INDEPENDENT_AMBULATORY_CARE_PROVIDER_SITE_OTHER): Payer: Medicare Other | Admitting: Cardiology

## 2011-09-22 VITALS — BP 159/61 | HR 48 | Resp 16 | Ht 72.0 in | Wt 267.0 lb

## 2011-09-22 DIAGNOSIS — R609 Edema, unspecified: Secondary | ICD-10-CM

## 2011-09-22 DIAGNOSIS — I10 Essential (primary) hypertension: Secondary | ICD-10-CM

## 2011-09-22 DIAGNOSIS — N189 Chronic kidney disease, unspecified: Secondary | ICD-10-CM

## 2011-09-22 DIAGNOSIS — F068 Other specified mental disorders due to known physiological condition: Secondary | ICD-10-CM

## 2011-09-22 DIAGNOSIS — I5032 Chronic diastolic (congestive) heart failure: Secondary | ICD-10-CM

## 2011-09-22 DIAGNOSIS — Z954 Presence of other heart-valve replacement: Secondary | ICD-10-CM

## 2011-09-22 MED ORDER — CLONIDINE HCL 0.1 MG PO TABS: 0.1000 mg | ORAL_TABLET | Freq: Every day | ORAL | Status: DC

## 2011-09-22 NOTE — Assessment & Plan Note (Signed)
No evidence of valvular dysfunction. Stable prosthesis.

## 2011-09-22 NOTE — Assessment & Plan Note (Signed)
Appears to be getting worse and is followed by his primary care physician

## 2011-09-22 NOTE — Patient Instructions (Addendum)
   Stop Mavik  Clonidine 0.1mg  daily Your physician wants you to follow up in: 6 months.  You will receive a reminder letter in the mail one-two months in advance.  If you don't receive a letter, please call our office to schedule the follow up appointment

## 2011-09-22 NOTE — Assessment & Plan Note (Signed)
Followed by nephrology. 

## 2011-09-22 NOTE — Progress Notes (Signed)
Cody Bottoms, MD, [facility] Board Certified in Adult Cardiovascular Medicine,Internal Medicine and Critical Care Medicine    CC: Followup patient history of bioprosthetic aortic valve replacement  HPI:  The patient is doing well from a cardiovascular perspective. He actually has significant weight loss approximately 20 pounds. He does not report any shortness of breath or chest pain. He reports no palpitations presyncope or syncope. Unfortunately his dementia is getting significantly worse. According to the wife the patient spends all his time in bed and only comes down for his meals. He has some chronic nonpitting edema. Lasix has been cut back by his nephrologist. Blood pressures further increased, apparently patient's wife ran out of [facility] and is not been getting this for a while. Otherwise from a cardiac perspective the patient is stable and has not required any heart failure admissions.   PMH: reviewed and listed in Problem List in Electronic Records (and see below) Past Medical History  Diagnosis Date  . Syncope     hx, but no recurrence  . Observed sleep apnea   . Dementia   . Diabetes mellitus   . Dyslipidemia   . Kidney disease     with creatinine of 1.5  . Diastolic heart failure   . HLD (hyperlipidemia)    Past Surgical History  Procedure Date  . Tonsillectomy   . Percutaneous drainage of pericardia leffusion aug 2004  . Aortic valve replacement     Allergies/SH/FHX : available in Electronic Records for review  No Known Allergies History   Social History  . Marital Status: Married    Spouse Name: N/A    Number of Children: N/A  . Years of Education: N/A   Occupational History  . Not on file.   Social History Main Topics  . Smoking status: Former Smoker -- 1.5 packs/day for 49 years    Types: Cigarettes    Quit date: 03/29/1998  . Smokeless tobacco: Never Used  . Alcohol Use: Not on file  . Drug Use: Not on file  . Sexually Active: Not on file    Other Topics Concern  . Not on file   Social History Narrative  . No narrative on file   Family History  Problem Relation Age of Onset  . Coronary artery disease Neg Hx   . Hypertension Neg Hx   . Diabetes Neg Hx     Medications: Current Outpatient Prescriptions  Medication Sig Dispense Refill  . atorvastatin (LIPITOR) 10 MG tablet Take 10 mg by mouth. Daily at bedtime       . benazepril (LOTENSIN) 40 MG tablet Take 40 mg by mouth daily.        Marland Kitchen donepezil (ARICEPT) 10 MG tablet Take 10 mg by mouth daily.        . felodipine (PLENDIL) 10 MG 24 hr tablet Take 10 mg by mouth daily.        . furosemide (LASIX) 40 MG tablet Take 40 mg by mouth 2 (two) times daily.       Marland Kitchen glipiZIDE (GLIPIZIDE XL) 10 MG 24 hr tablet Take 10 mg by mouth daily.        . lansoprazole (PREVACID) 30 MG capsule Take 30 mg by mouth daily.        . Levamisole HCl POWD 100 Units by Does not apply route as directed.       . memantine (NAMENDA) 10 MG tablet Take 10 mg by mouth 2 (two) times daily.        Marland Kitchen  metoprolol (TOPROL XL) 50 MG 24 hr tablet Take 50 mg by mouth daily.        . pioglitazone (ACTOS) 45 MG tablet Take 45 mg by mouth daily.        . cloNIDine (CATAPRES) 0.1 MG tablet Take 1 tablet (0.1 mg total) by mouth daily.  90 tablet  3  . DISCONTD: cloNIDine (CATAPRES) 0.1 MG tablet Take 1 tablet (0.1 mg total) by mouth daily.  30 tablet  6    ROS: No nausea or vomiting. No fever or chills.No melena or hematochezia.No bleeding.No claudication  Physical Exam: BP 159/61  Pulse 48  Resp 16  Ht 6' (1.829 m)  Wt 267 lb (121.11 kg)  BMI 36.21 kg/m2 General: Overweight white male with flat affect secondary to dementia Neck: Normal carotid upstroke no carotid bruits. No thyromegaly nonnodular thyroid. JVP 7 cm Lungs: Clear breath sounds bilaterally no wheezing. Cardiac: Regular rate and rhythm with normal S1-S2 no pathological murmurs Vascular: No edema. Normal distal pulses Skin: Warm and  dry Physcologic: Flat affect significant dementia with oriented x3. No focal findings on neurological examination  12lead ECG: Not obtained Limited bedside ECHO:N/A No images are attached to the encounter.   Assessment and Plan  AORTIC VALVE REPLACEMENT, HX OF No evidence of valvular dysfunction. Stable prosthesis.  DIASTOLIC HEART FAILURE, CHRONIC Patient has lost 20 pounds in weight. There is no evidence of volume overload. Continue his current medical regimen  DEMENTIA Appears to be getting worse and is followed by his primary care physician  EDEMA Stable no need for additional diuresis. As matter fact his nephrologist has decreased his Lasix to 40 mg by mouth twice a day.  ESSENTIAL HYPERTENSION, BENIGN Blood pressure is poorly controlled. I did stop a combination of an ACE inhibitor or an ARB given his renal insufficiency. We discontinued Mavik. I asked clonidine 0.1 mg by mouth daily which can be further increased as necessary. Patient will be seeing his primary care physician next week and apparently blood work has been scheduled.  RENAL FAILURE, CHRONIC Followed by nephrology.   Patient Active Problem List  Diagnosis  . DM  . HYPERLIPIDEMIA-MIXED  . DEMENTIA  . SLEEP APNEA, OBSTRUCTIVE  . NARCOLEPSY WITHOUT CATAPLEXY  . ESSENTIAL HYPERTENSION, BENIGN  . DIASTOLIC HEART FAILURE, CHRONIC  . RENAL FAILURE, CHRONIC  . EDEMA  . SYNCOPE, HX OF  . AORTIC VALVE REPLACEMENT, HX OF

## 2011-09-22 NOTE — Assessment & Plan Note (Addendum)
Blood pressure is poorly controlled. I did stop a combination of an ACE inhibitor or an ARB given his renal insufficiency. We discontinued Mavik. I asked clonidine 0.1 mg by mouth daily which can be further increased as necessary. Patient will be seeing his primary care physician next week and apparently blood work has been scheduled.

## 2011-09-22 NOTE — Assessment & Plan Note (Signed)
Stable no need for additional diuresis. As matter fact his nephrologist has decreased his Lasix to 40 mg by mouth twice a day.

## 2011-09-22 NOTE — Assessment & Plan Note (Signed)
Patient has lost 20 pounds in weight. There is no evidence of volume overload. Continue his current medical regimen

## 2012-01-06 ENCOUNTER — Other Ambulatory Visit: Payer: Self-pay | Admitting: *Deleted

## 2012-01-06 MED ORDER — FUROSEMIDE 40 MG PO TABS: 40.0000 mg | ORAL_TABLET | Freq: Two times a day (BID) | ORAL | Status: DC

## 2012-01-06 MED ORDER — BENAZEPRIL HCL 40 MG PO TABS: 40.0000 mg | ORAL_TABLET | Freq: Every day | ORAL | Status: DC

## 2012-01-06 MED ORDER — CLONIDINE HCL 0.1 MG PO TABS: 0.1000 mg | ORAL_TABLET | Freq: Every day | ORAL | Status: DC

## 2012-01-06 MED ORDER — METOPROLOL SUCCINATE ER 50 MG PO TB24: 50.0000 mg | ORAL_TABLET | Freq: Every day | ORAL | Status: DC

## 2012-01-07 ENCOUNTER — Other Ambulatory Visit: Payer: Self-pay | Admitting: *Deleted

## 2012-01-07 MED ORDER — CLONIDINE HCL 0.1 MG PO TABS: 0.1000 mg | ORAL_TABLET | Freq: Every day | ORAL | Status: DC

## 2012-04-03 ENCOUNTER — Encounter: Payer: Self-pay | Admitting: Cardiology

## 2012-04-03 ENCOUNTER — Ambulatory Visit (INDEPENDENT_AMBULATORY_CARE_PROVIDER_SITE_OTHER): Payer: Medicare Other | Admitting: Cardiology

## 2012-04-03 VITALS — BP 101/63 | HR 66 | Ht 72.0 in | Wt 261.0 lb

## 2012-04-03 DIAGNOSIS — E785 Hyperlipidemia, unspecified: Secondary | ICD-10-CM

## 2012-04-03 DIAGNOSIS — I4892 Unspecified atrial flutter: Secondary | ICD-10-CM

## 2012-04-03 DIAGNOSIS — Z954 Presence of other heart-valve replacement: Secondary | ICD-10-CM

## 2012-04-03 DIAGNOSIS — R0602 Shortness of breath: Secondary | ICD-10-CM

## 2012-04-03 DIAGNOSIS — I5032 Chronic diastolic (congestive) heart failure: Secondary | ICD-10-CM

## 2012-04-03 DIAGNOSIS — I10 Essential (primary) hypertension: Secondary | ICD-10-CM

## 2012-04-03 NOTE — Progress Notes (Signed)
HPI The patient presents for followup of his aortic valve replacement. His last echo in December 2012 demonstrated a well preserved ejection fraction with a stable bioprosthesis.  Unfortunately he has had progressive dementia. He is wife, is both their second marriage, and a 77-year-old adopted child. His dementia has progressed to the point where he cannot even read to her. He does very little in the house but gets around independently. There apparently is some dyspnea with exertion. However, he's not been having any PND or orthopnea. He's not been having any apparent palpitations, presyncope or syncope. There has been no chest pressure, neck or arm discomfort. He's had a slow weight loss because they've been better. He does have some chronic mild lower extremity swelling.  No Known Allergies  Current Outpatient Prescriptions  Medication Sig Dispense Refill  . atorvastatin (LIPITOR) 10 MG tablet Take 10 mg by mouth. Daily at bedtime       . benazepril (LOTENSIN) 40 MG tablet Take 1 tablet (40 mg total) by mouth daily.  90 tablet  3  . cloNIDine (CATAPRES) 0.1 MG tablet Take 1 tablet (0.1 mg total) by mouth daily.  90 tablet  3  . donepezil (ARICEPT) 10 MG tablet Take 10 mg by mouth daily.        . furosemide (LASIX) 40 MG tablet Take 1 tablet (40 mg total) by mouth 2 (two) times daily.  180 tablet  3  . glipiZIDE (GLIPIZIDE XL) 10 MG 24 hr tablet Take 10 mg by mouth daily.        Marland Kitchen HYDROcodone-acetaminophen (NORCO/VICODIN) 5-325 MG per tablet Take 1 tablet by mouth Three times daily as needed.      . lansoprazole (PREVACID) 30 MG capsule Take 30 mg by mouth daily.        . Levamisole HCl POWD 100 Units by Does not apply route as directed.       . memantine (NAMENDA) 10 MG tablet Take 10 mg by mouth 2 (two) times daily.        . metoprolol succinate (TOPROL XL) 50 MG 24 hr tablet Take 1 tablet (50 mg total) by mouth daily.  90 tablet  3    Past Medical History  Diagnosis Date  . Syncope    hx, but no recurrence  . Observed sleep apnea   . Dementia   . Diabetes mellitus   . Dyslipidemia   . Kidney disease     with creatinine of 1.5  . Diastolic heart failure   . HLD (hyperlipidemia)     Past Surgical History  Procedure Date  . Tonsillectomy   . Percutaneous drainage of pericardia leffusion aug 2004  . Aortic valve replacement     ROS:  As stated in the HPI and negative for all other systems.  PHYSICAL EXAM BP 101/63  Pulse 66  Ht 6' (1.829 m)  Wt 261 lb (118.389 kg)  BMI 35.40 kg/m2 GENERAL:  Well appearing HEENT:  Pupils equal round and reactive, fundi not visualized, oral mucosa unremarkable NECK:  No jugular venous distention, waveform within normal limits, carotid upstroke brisk and symmetric, no bruits, no thyromegaly LYMPHATICS:  No cervical, inguinal adenopathy LUNGS:  Clear to auscultation bilaterally BACK:  No CVA tenderness CHEST:  Well healed sternotomy scar. HEART:  PMI not displaced or sustained,S1 and S2 within normal limits, no S3, no S4, no clicks, no rubs, no murmurs ABD:  Flat, positive bowel sounds normal in frequency in pitch, no bruits, no rebound, no  guarding, no midline pulsatile mass, no hepatomegaly, no splenomegaly EXT:  2 plus pulses throughout, no edema, no cyanosis no clubbing SKIN:  No rashes no nodules NEURO:  Cranial nerves II through XII grossly intact, motor grossly intact throughout PSYCH:  Oriented to person and place.   EKG:  Atrial flutter, rate 66, axis within normal limits, intervals within normal limits, no acute ST-T wave changes. 04/03/2012  ASSESSMENT AND PLAN  ATRIAL FLUTTER: This appears to be in new dysrhythmia. He would certainly be at risk for embolic events. With his renal insufficiency he would only be able to take warfarin. I will place a 24-hour monitor to further evaluate the rate. I need to check some baseline labs including CBC. I will bring him back soon after the monitor to discuss further  anticoagulation. He certainly is at risk for anticoagulation, given his frailty and comorbidities. However, I will suggest this to his wife.    AORTIC VALVE REPLACEMENT, HX OF  He had an echocardiogram last year with a stable prosthesis. He has no new symptoms. No change in therapy is indicated.  DIASTOLIC HEART FAILURE, CHRONIC  He seems to be euvolemic.  At this point, no change in therapy is indicated.  We have reviewed salt and fluid restrictions.  No further cardiovascular testing is indicated.  DEMENTIA  Severe and progressive per his wife.   EDEMA  This is mild.  No change in therapy is indicated.   ESSENTIAL HYPERTENSION, BENIGN  His blood pressure is actually running low. However, his meds were titrated last year because of hypertension. Therefore, today I will not reduce his medications but I did discuss with his wife the symptoms of low blood pressure ruled out for. Given the opportunity we could reduce medications.  RENAL FAILURE, CHRONIC  Followed by nephrology.

## 2012-04-03 NOTE — Patient Instructions (Addendum)
Your physician recommends that you schedule a follow-up appointment in: 1year. You will receive a reminder letter in the mail in about 10 months reminding you to call and schedule your appointment. If you don't receive this letter, please contact our office. Your physician recommends that you continue on your current medications as directed. Please refer to the Current Medication list given to you today.  Your physician recommends that you return for lab work on Friday April 07, 2012 at [facility] for [facility]. Will give orders to patient during nurse visit. Your physician has recommended that you wear a 24 holter monitor. Holter monitors are medical devices that record the heart's electrical activity. Doctors most often use these monitors to diagnose arrhythmias. Arrhythmias are problems with the speed or rhythm of the heartbeat. The monitor is a small, portable device. You can wear one while you do your normal daily activities. This is usually used to diagnose what is causing palpitations/syncope (passing out). Patient informed to come to office on 04/07/12 @9 :15 am to have this placed. Please return to the office on May 26, 2012 @10 :00 am to see Dr. Antoine Poche.

## 2012-04-07 ENCOUNTER — Ambulatory Visit: Payer: Medicare Other | Admitting: *Deleted

## 2012-04-07 ENCOUNTER — Other Ambulatory Visit: Payer: Self-pay | Admitting: *Deleted

## 2012-04-07 DIAGNOSIS — I4892 Unspecified atrial flutter: Secondary | ICD-10-CM

## 2012-04-07 DIAGNOSIS — R0602 Shortness of breath: Secondary | ICD-10-CM

## 2012-04-07 DIAGNOSIS — I10 Essential (primary) hypertension: Secondary | ICD-10-CM

## 2012-04-07 DIAGNOSIS — I5032 Chronic diastolic (congestive) heart failure: Secondary | ICD-10-CM

## 2012-04-07 DIAGNOSIS — I4891 Unspecified atrial fibrillation: Secondary | ICD-10-CM

## 2012-04-14 ENCOUNTER — Telehealth: Payer: Self-pay | Admitting: *Deleted

## 2012-04-14 NOTE — Telephone Encounter (Signed)
Message copied by Eustace Moore on Fri Apr 14, 2012  4:18 PM ------      Message from: Rollene Rotunda      Created: Wed Apr 12, 2012  4:59 PM       Creat is lower than previous.  Other labs are stable. Call Mr. Harig with the results and send results to [facility], MD

## 2012-04-14 NOTE — Telephone Encounter (Signed)
Patient informed and also left message for wife to call office to get results also.

## 2012-04-14 NOTE — Telephone Encounter (Signed)
Patient returned your call.

## 2012-04-18 NOTE — Telephone Encounter (Signed)
Patient's wife informed

## 2012-04-20 ENCOUNTER — Telehealth: Payer: Self-pay | Admitting: *Deleted

## 2012-04-20 NOTE — Telephone Encounter (Signed)
Wife informed and appointment scheduled 05/04/12 @11 :30 am at [facility]. Office.

## 2012-04-20 NOTE — Telephone Encounter (Signed)
Message copied by Eustace Moore on Thu Apr 20, 2012 10:39 AM ------      Message from: FLEMING, PAMELA J      Created: Wed Apr 19, 2012  5:47 PM                   ----- Message -----         From: Burnell Blanks         Sent: 04/14/2012   5:26 PM           To: Rocco Serene, RN            Will forward to Orlie Dakin RN with Dr Antoine Poche

## 2012-05-04 ENCOUNTER — Ambulatory Visit: Payer: Medicare Other | Admitting: Cardiology

## 2012-05-08 ENCOUNTER — Encounter: Payer: Self-pay | Admitting: Cardiology

## 2012-05-08 ENCOUNTER — Ambulatory Visit (INDEPENDENT_AMBULATORY_CARE_PROVIDER_SITE_OTHER): Payer: Medicare Other | Admitting: Cardiology

## 2012-05-08 VITALS — BP 99/80 | HR 63 | Ht 72.0 in | Wt 261.8 lb

## 2012-05-08 DIAGNOSIS — I5032 Chronic diastolic (congestive) heart failure: Secondary | ICD-10-CM

## 2012-05-08 MED ORDER — APIXABAN 2.5 MG PO TABS: 2.5000 mg | ORAL_TABLET | Freq: Two times a day (BID) | ORAL | Status: DC

## 2012-05-08 NOTE — Patient Instructions (Addendum)
Please start Eliquis 2.5 mg one tablet twice a day. Continue all other medications as listed  Follow up with Dr Antoine Poche in 3 months.

## 2012-05-08 NOTE — Progress Notes (Signed)
HPI The patient presents for followup of atrial flutter.  I discovered this at the time of his last visit. He actually was not complaining of any palpitations and has had no presyncope or syncope. He doesn't have any complaints the quite limited by dementia.Marland Kitchen His last echo in December 2012 demonstrated a well preserved ejection fraction with a stable bioprosthesis.   He does very little in the house but gets around independently. There he has reported some dyspnea with exertion. However, he's not been having any PND or orthopnea.  There has been no chest pressure, neck or arm discomfort.  He does have some chronic mild lower extremity swelling.  Of importance he does not fall. There is no evidence of bleeding actively.    No Known Allergies  Current Outpatient Prescriptions  Medication Sig Dispense Refill  . atorvastatin (LIPITOR) 10 MG tablet Take 10 mg by mouth. Daily at bedtime       . benazepril (LOTENSIN) 40 MG tablet Take 1 tablet (40 mg total) by mouth daily.  90 tablet  3  . cloNIDine (CATAPRES) 0.1 MG tablet Take 1 tablet (0.1 mg total) by mouth daily.  90 tablet  3  . donepezil (ARICEPT) 10 MG tablet Take 10 mg by mouth daily.        . furosemide (LASIX) 40 MG tablet Take 1 tablet (40 mg total) by mouth 2 (two) times daily.  180 tablet  3  . glipiZIDE (GLIPIZIDE XL) 10 MG 24 hr tablet Take 10 mg by mouth daily.        Marland Kitchen HYDROcodone-acetaminophen (NORCO/VICODIN) 5-325 MG per tablet Take 1 tablet by mouth Three times daily as needed.      . lansoprazole (PREVACID) 30 MG capsule Take 30 mg by mouth daily.        . Levamisole HCl POWD 100 Units by Does not apply route as directed.       . memantine (NAMENDA) 10 MG tablet Take 10 mg by mouth 2 (two) times daily.        . metoprolol succinate (TOPROL XL) 50 MG 24 hr tablet Take 1 tablet (50 mg total) by mouth daily.  90 tablet  3   No current facility-administered medications for this visit.    Past Medical History  Diagnosis Date  .  Syncope     hx, but no recurrence  . Observed sleep apnea   . Dementia   . Diabetes mellitus   . Dyslipidemia   . Kidney disease     with creatinine of 1.5  . Diastolic heart failure   . HLD (hyperlipidemia)     Past Surgical History  Procedure Laterality Date  . Tonsillectomy    . Percutaneous drainage of pericardia leffusion  aug 2004  . Aortic valve replacement      ROS:  As stated in the HPI and negative for all other systems.  PHYSICAL EXAM BP 99/80  Pulse 63  Ht 6' (1.829 m)  Wt 261 lb 12.8 oz (118.752 kg)  BMI 35.5 kg/m2 GENERAL:  Well appearing HEENT:  Pupils equal round and reactive, fundi not visualized, oral mucosa unremarkable NECK:  No jugular venous distention, waveform within normal limits, carotid upstroke brisk and symmetric, no bruits, no thyromegaly LYMPHATICS:  No cervical, inguinal adenopathy LUNGS:  Clear to auscultation bilaterally BACK:  No CVA tenderness CHEST:  Well healed sternotomy scar. HEART:  PMI not displaced or sustained,S1 and S2 within normal limits, no S3, no S4, no clicks, no rubs,  no murmurs ABD:  Flat, positive bowel sounds normal in frequency in pitch, no bruits, no rebound, no guarding, no midline pulsatile mass, no hepatomegaly, no splenomegaly EXT:  2 plus pulses throughout, no edema, no cyanosis no clubbing SKIN:  No rashes no nodules NEURO:  Cranial nerves II through XII grossly intact, motor grossly intact throughout PSYCH:  Oriented to person and place.   EKG:  Atrial flutter, rate 63, axis within normal limits, intervals within normal limits, no acute ST-T wave changes. 05/08/2012  ASSESSMENT AND PLAN  ATRIAL FLUTTER: I reviewed with him at great length the risks and benefits of anticoagulation. He is still in flutter. I reviewed the Holter monitor.  [name] has a CHA2DS2 - VASc score of 4 with a risk of stroke of 4%  and a HAS - BLED score of 2.with one validation study suggesting a low risk of bleeding.  I will  start Eliquis.  I have agreed consideration into the dosing. And although he does not have absolute indications with the lowest does he is borderline on his renal function and age. I will use 2.5 mg twice daily.   AORTIC VALVE REPLACEMENT, HX OF  He had an echocardiogram last year with a stable prosthesis. He has no new symptoms. No change in therapy is indicated.  DIASTOLIC HEART FAILURE, CHRONIC  He seems to be euvolemic.  At this point, no change in therapy is indicated.  We have reviewed salt and fluid restrictions.  No further cardiovascular testing is indicated.  DEMENTIA  Severe and progressive per his wife.   EDEMA  This is mild.  No change in therapy is indicated.   ESSENTIAL HYPERTENSION, BENIGN  He has had no symptoms of low blood pressure. He will remain on the meds as listed.  RENAL FAILURE, CHRONIC  His most recent creatinine clearance was greater than 40. This is being followed routinely every few months by nephrology. As above this will allow treatment with Eliquis.

## 2012-05-26 ENCOUNTER — Ambulatory Visit: Payer: Medicare Other | Admitting: Cardiology

## 2012-06-24 ENCOUNTER — Encounter: Payer: Self-pay | Admitting: Physician Assistant

## 2012-06-25 ENCOUNTER — Encounter: Payer: Self-pay | Admitting: Physician Assistant

## 2012-06-26 ENCOUNTER — Encounter: Payer: Self-pay | Admitting: Physician Assistant

## 2012-06-28 ENCOUNTER — Encounter: Payer: Self-pay | Admitting: Physician Assistant

## 2012-07-05 ENCOUNTER — Encounter: Payer: Self-pay | Admitting: Cardiology

## 2012-08-07 ENCOUNTER — Ambulatory Visit: Payer: Medicare Other | Admitting: Cardiology

## 2012-08-30 ENCOUNTER — Encounter: Payer: Self-pay | Admitting: Cardiology

## 2012-08-30 ENCOUNTER — Ambulatory Visit (INDEPENDENT_AMBULATORY_CARE_PROVIDER_SITE_OTHER): Payer: Medicare Other | Admitting: Cardiology

## 2012-08-30 VITALS — BP 100/70 | HR 75 | Ht 72.0 in | Wt 268.8 lb

## 2012-08-30 DIAGNOSIS — I5032 Chronic diastolic (congestive) heart failure: Secondary | ICD-10-CM

## 2012-08-30 MED ORDER — AMIODARONE HCL 200 MG PO TABS: 200.0000 mg | ORAL_TABLET | Freq: Every day | ORAL | Status: DC

## 2012-08-30 NOTE — Patient Instructions (Addendum)
The current medical regimen is effective;  continue present plan and medications.  Follow up in [place] office with Dr Antoine Poche in 2 months.

## 2012-08-30 NOTE — Progress Notes (Signed)
HPI The patient presents for followup of atrial flutter.  Since I last saw him he was hospitalized at [facility].  He was seen by Dr Andee Lineman and I don't have this consult note.  I do have a discharge summary dimensions lower extremity cellulitis and staph epidermidis bacteremia. He had apparently acute on chronic heart failure. He had his atrial flutter with a rapid rate and was actually started on the amiodarone. His wife reports that he required 3 units of blood during that hospitalization.. I actually can't find at this in the records. I do note that he is no longer on anticoagulation.  He returns now for followup. He is not apparently reporting any acute symptoms. In particular he is not describing chest pressure, neck or arm discomfort. He is not having palpitations, presyncope or syncope. There is apparently joint pain and pain in his feet. Of note his wife did report a fall prior to his hospitalization with some trauma to his arm. He had to recover in a nursing home for a while. He's not describing any new shortness of breath, PND or orthopnea.  No Known Allergies  Current Outpatient Prescriptions  Medication Sig Dispense Refill  . amiodarone (PACERONE) 200 MG tablet       . atorvastatin (LIPITOR) 10 MG tablet Take 10 mg by mouth. Daily at bedtime       . benazepril (LOTENSIN) 40 MG tablet Take 1 tablet (40 mg total) by mouth daily.  90 tablet  3  . cloNIDine (CATAPRES) 0.1 MG tablet Take 1 tablet (0.1 mg total) by mouth daily.  90 tablet  3  . donepezil (ARICEPT) 10 MG tablet Take 10 mg by mouth daily.        . furosemide (LASIX) 40 MG tablet Take 1 tablet (40 mg total) by mouth 2 (two) times daily.  180 tablet  3  . glipiZIDE (GLIPIZIDE XL) 10 MG 24 hr tablet Take 10 mg by mouth daily.        Marland Kitchen HYDROcodone-acetaminophen (NORCO/VICODIN) 5-325 MG per tablet Take 1 tablet by mouth Three times daily as needed.      . lansoprazole (PREVACID) 30 MG capsule Take 30 mg by mouth daily.         . Levamisole HCl POWD 100 Units by Does not apply route as directed.       Marland Kitchen LORazepam (ATIVAN) 0.5 MG tablet       . memantine (NAMENDA) 10 MG tablet Take 10 mg by mouth 2 (two) times daily.        . metoprolol succinate (TOPROL XL) 50 MG 24 hr tablet Take 1 tablet (50 mg total) by mouth daily.  90 tablet  3  . ONE TOUCH ULTRA TEST test strip        No current facility-administered medications for this visit.    Past Medical History  Diagnosis Date  . Syncope     hx, but no recurrence  . Observed sleep apnea   . Dementia   . Diabetes mellitus   . Dyslipidemia   . Kidney disease     with creatinine of 1.5  . Diastolic heart failure   . HLD (hyperlipidemia)     Past Surgical History  Procedure Laterality Date  . Tonsillectomy    . Percutaneous drainage of pericardia leffusion  aug 2004  . Aortic valve replacement      ROS:  As stated in the HPI and negative for all other systems.  PHYSICAL EXAM BP 100/70  Pulse 75  Ht 6' (1.829 m)  Wt 268 lb 12.8 oz (121.927 kg)  BMI 36.45 kg/m2 GENERAL:  Well appearing HEENT:  Pupils equal round and reactive, fundi not visualized, oral mucosa unremarkable NECK:  No jugular venous distention, waveform within normal limits, carotid upstroke brisk and symmetric, no bruits, no thyromegaly LYMPHATICS:  No cervical, inguinal adenopathy LUNGS:  Clear to auscultation bilaterally BACK:  No CVA tenderness CHEST:  Well healed sternotomy scar. HEART:  PMI not displaced or sustained,S1 and S2 within normal limits, no S3, no S4, no clicks, no rubs, no murmurs ABD:  Flat, positive bowel sounds normal in frequency in pitch, no bruits, no rebound, no guarding, no midline pulsatile mass, no hepatomegaly, no splenomegaly EXT:  2 plus pulses throughout, no edema, no cyanosis no clubbing SKIN:  No rashes no nodules NEURO:  Cranial nerves II through XII grossly intact, motor grossly intact throughout PSYCH:  Oriented to person and place.   EKG:   Atrial flutter, rate 75, axis within normal limits, intervals within normal limits, no acute ST-T wave changes. 08/30/2012  ASSESSMENT AND PLAN  ATRIAL FLUTTER: I reviewed with him at great length the risks and benefits of anticoagulation. He is still in flutter.  [name] has a CHA2DS2 - VASc score of 4 with a risk of stroke of 4%  and a HAS - BLED score of 2 with one validation study suggesting a low risk of bleeding.  Unfortunately given the report above he apparently is not a candidate for anticoagulation. Given this he is not a candidate for cardioversion which apparently was discussed recently. I will for now continue the amiodarone for rate control but likely will discontinue this in the future as elevated right has not been an issue. I had a discussion with the patient's wife about the risk benefit of anticoagulation.  AORTIC VALVE REPLACEMENT, HX OF  I reviewed the echocardiogram done during his hospitalization in April. He had stable bioprosthetic aortic valve. The EF was 40-45%. He has no new symptoms. No change in therapy is indicated.  DIASTOLIC HEART FAILURE, CHRONIC  He seems to be euvolemic.  At this point, no change in therapy is indicated.  We have reviewed salt and fluid restrictions.  No further cardiovascular testing is indicated.  DEMENTIA  Severe and progressive per his wife.   EDEMA  This is mild.  No change in therapy is indicated.   ESSENTIAL HYPERTENSION, BENIGN  He has had no symptoms of low blood pressure. He will remain on the meds as listed.  RENAL FAILURE, CHRONIC  His last creatinine that I can find was 1.7. No change in therapy is indicated. Of note the peak creatinine prior to discharge appears to be about 2.38.

## 2012-11-09 ENCOUNTER — Ambulatory Visit (INDEPENDENT_AMBULATORY_CARE_PROVIDER_SITE_OTHER): Payer: Medicare Other | Admitting: Cardiology

## 2012-11-09 ENCOUNTER — Encounter: Payer: Self-pay | Admitting: Cardiology

## 2012-11-09 VITALS — BP 83/61 | HR 84 | Ht 72.0 in | Wt 246.1 lb

## 2012-11-09 DIAGNOSIS — I5032 Chronic diastolic (congestive) heart failure: Secondary | ICD-10-CM

## 2012-11-09 MED ORDER — ATORVASTATIN CALCIUM 20 MG PO TABS: 20.0000 mg | ORAL_TABLET | Freq: Every day | ORAL | Status: DC

## 2012-11-09 NOTE — Progress Notes (Signed)
HPI The patient presents for followup of atrial flutter and heart failure.  Since I last saw him he is not apparently reporting any acute symptoms. In particular he is not describing chest pressure, neck or arm discomfort. He is not having palpitations, presyncope or syncope. There is apparently joint pain and pain in his feet which is unchanged and chronic. He's not describing any new shortness of breath, PND or orthopnea.  Is very much watching his salt. His lower externally swelling is much improved. He's not had any further falls. He is limited by his severe dementia.  No Known Allergies  Current Outpatient Prescriptions  Medication Sig Dispense Refill  . amiodarone (PACERONE) 200 MG tablet Take 1 tablet (200 mg total) by mouth daily.  90 tablet  3  . atorvastatin (LIPITOR) 20 MG tablet Take 20 mg by mouth daily.      . benazepril (LOTENSIN) 40 MG tablet Take 1 tablet (40 mg total) by mouth daily.  90 tablet  3  . cloNIDine (CATAPRES) 0.1 MG tablet Take 1 tablet (0.1 mg total) by mouth daily.  90 tablet  3  . donepezil (ARICEPT) 10 MG tablet Take 10 mg by mouth daily.        . furosemide (LASIX) 40 MG tablet Take 1 tablet (40 mg total) by mouth 2 (two) times daily.  180 tablet  3  . glipiZIDE (GLIPIZIDE XL) 10 MG 24 hr tablet Take 10 mg by mouth daily.        Marland Kitchen HYDROcodone-acetaminophen (NORCO/VICODIN) 5-325 MG per tablet Take 1 tablet by mouth Three times daily as needed.      . insulin aspart (NOVOLOG) 100 UNIT/ML injection Inject 6 Units into the skin as needed for high blood sugar.      . lansoprazole (PREVACID) 30 MG capsule Take 30 mg by mouth daily.        Marland Kitchen LORazepam (ATIVAN) 0.5 MG tablet Take 0.5 mg by mouth every 12 (twelve) hours as needed.       . memantine (NAMENDA) 10 MG tablet Take 10 mg by mouth 2 (two) times daily.        . metoprolol succinate (TOPROL XL) 50 MG 24 hr tablet Take 1 tablet (50 mg total) by mouth daily.  90 tablet  3  . ONE TOUCH ULTRA TEST test strip        . traMADol (ULTRAM) 50 MG tablet Take 50 mg by mouth every 8 (eight) hours as needed.        No current facility-administered medications for this visit.    Past Medical History  Diagnosis Date  . Syncope     hx, but no recurrence  . Observed sleep apnea   . Dementia   . Diabetes mellitus   . Dyslipidemia   . Kidney disease     with creatinine of 1.5  . Diastolic heart failure   . HLD (hyperlipidemia)     Past Surgical History  Procedure Laterality Date  . Tonsillectomy    . Percutaneous drainage of pericardia leffusion  aug 2004  . Aortic valve replacement      ROS:  As stated in the HPI and negative for all other systems.  Compromised by his dementia.  PHYSICAL EXAM BP 83/61  Pulse 84  Ht 6' (1.829 m)  Wt 246 lb 1.9 oz (111.639 kg)  BMI 33.37 kg/m2  SpO2 95% GENERAL:  Well appearing HEENT:  Pupils equal round and reactive, fundi not visualized, oral mucosa unremarkable NECK:  No jugular venous distention, waveform within normal limits, carotid upstroke brisk and symmetric, no bruits, no thyromegaly LYMPHATICS:  No cervical, inguinal adenopathy LUNGS:  Clear to auscultation bilaterally BACK:  No CVA tenderness CHEST:  Well healed sternotomy scar. HEART:  PMI not displaced or sustained,S1 and S2 within normal limits, no S3,  no clicks, no rubs, no murmurs ABD:  Flat, positive bowel sounds normal in frequency in pitch, no bruits, no rebound, no guarding, no midline pulsatile mass, no hepatomegaly, no splenomegaly EXT:  2 plus pulses throughout, trace edema, no cyanosis no clubbing SKIN:  No rashes no nodules NEURO:  Cranial nerves II through XII grossly intact, motor grossly intact throughout PSYCH:  Oriented to person and place. Pleasantly confused   ASSESSMENT AND PLAN  ATRIAL FLUTTER: He is rate controlled. We have had long discussions about this. The final conclusion is that he is high risk for anticoagulation and will remain off of this. His wife understands  the risk of stroke  AORTIC VALVE REPLACEMENT, HX OF  He had stable bioprosthetic aortic valve on his most recent echo.The EF was 40-45%. He has no new symptoms. No change in therapy is indicated.  DIASTOLIC HEART FAILURE, CHRONIC  He seems to be euvolemic.  At this point, no change in therapy is indicated.  He and his wife are doing very well with salt restriction.  DEMENTIA  Severe and progressive per his wife.   EDEMA  This is improved.  Continue current therapy.  ESSENTIAL HYPERTENSION, BENIGN  His blood pressure has consistently been running low. I will get rid of his clonidine.  RENAL FAILURE, CHRONIC  This had been stable recently. I will defer followup to Dr. Sherryll Burger

## 2012-11-09 NOTE — Patient Instructions (Addendum)
Your physician recommends that you schedule a follow-up appointment in: 6 months. You will receive a reminder letter in the mail in about 4 months reminding you to call and schedule your appointment. If you don't receive this letter, please contact our office. Your physician has recommended you make the following change in your medication: stop clonidine. All other medications will remain the same.

## 2012-11-15 ENCOUNTER — Other Ambulatory Visit: Payer: Self-pay | Admitting: Physician Assistant

## 2013-06-12 ENCOUNTER — Encounter: Payer: Self-pay | Admitting: Cardiology

## 2013-06-12 ENCOUNTER — Ambulatory Visit (INDEPENDENT_AMBULATORY_CARE_PROVIDER_SITE_OTHER): Payer: Medicare Other | Admitting: Cardiology

## 2013-06-12 VITALS — BP 136/82 | HR 96 | Ht 72.0 in | Wt 260.0 lb

## 2013-06-12 DIAGNOSIS — Z954 Presence of other heart-valve replacement: Secondary | ICD-10-CM

## 2013-06-12 DIAGNOSIS — I4892 Unspecified atrial flutter: Secondary | ICD-10-CM

## 2013-06-12 DIAGNOSIS — Z79899 Other long term (current) drug therapy: Secondary | ICD-10-CM

## 2013-06-12 DIAGNOSIS — I5032 Chronic diastolic (congestive) heart failure: Secondary | ICD-10-CM

## 2013-06-12 DIAGNOSIS — I10 Essential (primary) hypertension: Secondary | ICD-10-CM

## 2013-06-12 MED ORDER — METOPROLOL SUCCINATE ER 100 MG PO TB24: 100.0000 mg | ORAL_TABLET | Freq: Every day | ORAL | Status: AC

## 2013-06-12 MED ORDER — METOPROLOL SUCCINATE ER 100 MG PO TB24: 100.0000 mg | ORAL_TABLET | Freq: Every day | ORAL | Status: DC

## 2013-06-12 MED ORDER — TRAMADOL HCL 50 MG PO TABS: 50.0000 mg | ORAL_TABLET | Freq: Three times a day (TID) | ORAL | Status: DC | PRN

## 2013-06-12 NOTE — Patient Instructions (Addendum)
Your physician recommends that you schedule a follow-up appointment in: 1 year. You will receive a reminder letter in the mail in about 10 months reminding you to call and schedule your appointment. If you don't receive this letter, please contact our office. Your physician has recommended you make the following change in your medication:  STOP AMIODARONE. Increase your metoprolol succinate to 100 mg daily. You may take (2) of your 50 mg tablet daily until they are finished. Your new prescription was sent to your pharmacy. All other medications will remain the same. Your physician recommends that you have lab work to check your CMET, TSH.

## 2013-06-12 NOTE — Progress Notes (Signed)
HPI The patient presents for followup of atrial flutter and heart failure.  Since I last saw him he is not apparently reporting any acute symptoms. He is quite limited by dementia. His wife says he gets up from the bed to the table but has very limited activities otherwise. With his current activities he is not describing any new symptoms or she's not noticed any new complaints other than joint pains. In particular he is not describing chest pressure, neck or arm discomfort. He is not having palpitations, presyncope or syncope. There is apparently joint pain and pain in his feet which is unchanged and chronic. He's not describing any new shortness of breath, PND or orthopnea.  He has had some falls.  No Known Allergies  Current Outpatient Prescriptions  Medication Sig Dispense Refill  . amiodarone (PACERONE) 200 MG tablet Take 1 tablet (200 mg total) by mouth daily.  90 tablet  3  . atorvastatin (LIPITOR) 20 MG tablet Take 1 tablet (20 mg total) by mouth daily.  90 tablet  3  . benazepril (LOTENSIN) 40 MG tablet TAKE 1 TABLET DAILY  90 tablet  2  . donepezil (ARICEPT) 10 MG tablet Take 10 mg by mouth daily.        . furosemide (LASIX) 40 MG tablet TAKE 1 TABLET TWICE A DAY  180 tablet  2  . glipiZIDE (GLIPIZIDE XL) 10 MG 24 hr tablet Take 10 mg by mouth daily.        Marland Kitchen HYDROcodone-acetaminophen (NORCO/VICODIN) 5-325 MG per tablet Take 1 tablet by mouth Three times daily as needed.      . insulin aspart (NOVOLOG) 100 UNIT/ML injection Inject 6 Units into the skin as needed for high blood sugar.      . lansoprazole (PREVACID) 30 MG capsule Take 30 mg by mouth daily.        Marland Kitchen LORazepam (ATIVAN) 0.5 MG tablet Take 0.5 mg by mouth every 12 (twelve) hours as needed.       . memantine (NAMENDA) 10 MG tablet Take 10 mg by mouth 2 (two) times daily.        . metoprolol succinate (TOPROL-XL) 50 MG 24 hr tablet TAKE 1 TABLET DAILY  90 tablet  2  . ONE TOUCH ULTRA TEST test strip       . traMADol  (ULTRAM) 50 MG tablet Take 50 mg by mouth every 8 (eight) hours as needed.        No current facility-administered medications for this visit.    Past Medical History  Diagnosis Date  . Syncope     hx, but no recurrence  . Observed sleep apnea   . Dementia   . Diabetes mellitus   . Dyslipidemia   . Kidney disease     with creatinine of 1.5  . Diastolic heart failure   . HLD (hyperlipidemia)     Past Surgical History  Procedure Laterality Date  . Tonsillectomy    . Percutaneous drainage of pericardia leffusion  aug 2004  . Aortic valve replacement      ROS:  As stated in the HPI and negative for all other systems.  Compromised by his dementia.  PHYSICAL EXAM BP 136/82  Pulse 96  Ht 6' (1.829 m)  Wt 260 lb (117.935 kg)  BMI 35.25 kg/m2  SpO2 97% GENERAL:  Well appearing, but disheveled NECK:  No jugular venous distention, waveform within normal limits, carotid upstroke brisk and symmetric, no bruits, no thyromegaly LYMPHATICS:  No cervical,  inguinal adenopathy LUNGS:  Clear to auscultation bilaterally BACK:  No CVA tenderness CHEST:  Well healed sternotomy scar. HEART:  PMI not displaced or sustained,S1 and S2 within normal limits, no S3,  no clicks, no rubs, no murmurs, slightly irregular ABD:  Flat, positive bowel sounds normal in frequency in pitch, no bruits, no rebound, no guarding, no midline pulsatile mass, no hepatomegaly, no splenomegaly EXT:  2 plus pulses throughout, trace edema, no cyanosis no clubbing SKIN:  No rashes no nodules NEURO:  Cranial nerves II through XII grossly intact, motor grossly intact throughout PSYCH:  Oriented to person and place. Pleasantly confused  EKG:  Atrial flutter with. We'll conduction, rate 101, axis within normal limits, intervals within normal limits, no acute ST-T wave changes.  06/12/2013   ASSESSMENT AND PLAN  ATRIAL FLUTTER: He is rate controlled. We have had long discussions about this. The final conclusion is that  he is high risk for anticoagulation and will remain off of this. His wife understands the risk of stroke. I think the amiodarone is adding anything at this point. I will stop this and increase his metoprolol to 100 mg daily.  I will be checking some routine blood work including TSH and liver enzymes as well as labs listed below.  AORTIC VALVE REPLACEMENT, HX OF  He had stable bioprosthetic aortic valve on his most recent echo.The EF was 40-45%. He has no new symptoms. No change in therapy is indicated.  I will manage this conservatively and I do not plan to get another echo.  DIASTOLIC HEART FAILURE, CHRONIC  He seems to be euvolemic.  At this point, no change in therapy is indicated.  He and his wife are doing very well with salt restriction.  DEMENTIA  Severe and progressive per his wife.   EDEMA  This is mild.  Continue current therapy.   ESSENTIAL HYPERTENSION, BENIGN  The blood pressure is at target. No change in medications is indicated. We will continue with therapeutic lifestyle changes (TLC).   RENAL FAILURE, CHRONIC  I will check a BMET today.

## 2013-06-18 ENCOUNTER — Other Ambulatory Visit: Payer: Self-pay | Admitting: *Deleted

## 2013-06-18 ENCOUNTER — Telehealth: Payer: Self-pay | Admitting: *Deleted

## 2013-06-18 MED ORDER — TRAMADOL HCL 50 MG PO TABS: 50.0000 mg | ORAL_TABLET | Freq: Three times a day (TID) | ORAL | Status: AC | PRN

## 2013-06-18 NOTE — Telephone Encounter (Signed)
Patient informed. 

## 2013-06-18 NOTE — Telephone Encounter (Signed)
Message copied by Eustace MooreANDERSON, Reshaun Briseno M on Mon Jun 18, 2013  9:08 AM ------      Message from: Rollene RotundaHOCHREIN, JAMES      Created: Wed Jun 13, 2013  5:00 PM       Labs OK.  Call Mr. Latrelle DodrillGerman with the results and [facility], [facility]       ------

## 2013-07-10 ENCOUNTER — Other Ambulatory Visit: Payer: Self-pay | Admitting: Cardiology

## 2013-07-20 ENCOUNTER — Other Ambulatory Visit: Payer: Self-pay | Admitting: Cardiology

## 2013-09-19 ENCOUNTER — Other Ambulatory Visit: Payer: Self-pay | Admitting: Cardiology

## 2014-01-27 DEATH — deceased

## 2014-03-07 ENCOUNTER — Telehealth: Payer: Self-pay

## 2014-03-07 NOTE — Telephone Encounter (Signed)
Patient died @ [facility] per [facility] °
